# Patient Record
Sex: Female | Born: 1950
Health system: Southern US, Community
[De-identification: ages and names within clinical notes are randomized; demographics above are authoritative.]

## PROBLEM LIST (undated history)

## (undated) DIAGNOSIS — K629 Disease of anus and rectum, unspecified: Secondary | ICD-10-CM

## (undated) DIAGNOSIS — G589 Mononeuropathy, unspecified: Secondary | ICD-10-CM

## (undated) DIAGNOSIS — E785 Hyperlipidemia, unspecified: Secondary | ICD-10-CM

## (undated) DIAGNOSIS — Z8719 Personal history of other diseases of the digestive system: Secondary | ICD-10-CM

## (undated) DIAGNOSIS — M8580 Other specified disorders of bone density and structure, unspecified site: Secondary | ICD-10-CM

## (undated) DIAGNOSIS — Z973 Presence of spectacles and contact lenses: Secondary | ICD-10-CM

## (undated) DIAGNOSIS — Z87442 Personal history of urinary calculi: Secondary | ICD-10-CM

## (undated) DIAGNOSIS — I10 Essential (primary) hypertension: Secondary | ICD-10-CM

## (undated) DIAGNOSIS — K589 Irritable bowel syndrome without diarrhea: Secondary | ICD-10-CM

## (undated) DIAGNOSIS — M4800 Spinal stenosis, site unspecified: Secondary | ICD-10-CM

## (undated) DIAGNOSIS — K317 Polyp of stomach and duodenum: Secondary | ICD-10-CM

## (undated) DIAGNOSIS — E039 Hypothyroidism, unspecified: Secondary | ICD-10-CM

## (undated) DIAGNOSIS — R159 Full incontinence of feces: Secondary | ICD-10-CM

## (undated) DIAGNOSIS — M549 Dorsalgia, unspecified: Secondary | ICD-10-CM

## (undated) DIAGNOSIS — M5126 Other intervertebral disc displacement, lumbar region: Secondary | ICD-10-CM

## (undated) DIAGNOSIS — K648 Other hemorrhoids: Secondary | ICD-10-CM

## (undated) DIAGNOSIS — M5136 Other intervertebral disc degeneration, lumbar region: Secondary | ICD-10-CM

## (undated) DIAGNOSIS — M1990 Unspecified osteoarthritis, unspecified site: Secondary | ICD-10-CM

## (undated) DIAGNOSIS — G709 Myoneural disorder, unspecified: Secondary | ICD-10-CM

## (undated) DIAGNOSIS — G43909 Migraine, unspecified, not intractable, without status migrainosus: Secondary | ICD-10-CM

## (undated) DIAGNOSIS — K219 Gastro-esophageal reflux disease without esophagitis: Secondary | ICD-10-CM

## (undated) DIAGNOSIS — E049 Nontoxic goiter, unspecified: Secondary | ICD-10-CM

## (undated) DIAGNOSIS — D734 Cyst of spleen: Secondary | ICD-10-CM

## (undated) DIAGNOSIS — E781 Pure hyperglyceridemia: Secondary | ICD-10-CM

## (undated) DIAGNOSIS — J189 Pneumonia, unspecified organism: Secondary | ICD-10-CM

## (undated) DIAGNOSIS — M51369 Other intervertebral disc degeneration, lumbar region without mention of lumbar back pain or lower extremity pain: Secondary | ICD-10-CM

## (undated) DIAGNOSIS — Z86010 Personal history of colonic polyps: Secondary | ICD-10-CM

## (undated) DIAGNOSIS — R7303 Prediabetes: Secondary | ICD-10-CM

## (undated) DIAGNOSIS — K8020 Calculus of gallbladder without cholecystitis without obstruction: Secondary | ICD-10-CM

## (undated) DIAGNOSIS — G629 Polyneuropathy, unspecified: Secondary | ICD-10-CM

## (undated) DIAGNOSIS — R152 Fecal urgency: Secondary | ICD-10-CM

## (undated) HISTORY — DX: Pneumonia, unspecified organism: J18.9

## (undated) HISTORY — DX: Hypothyroidism, unspecified: E03.9

## (undated) HISTORY — DX: Other specified disorders of bone density and structure, unspecified site: M85.80

## (undated) HISTORY — DX: Myoneural disorder, unspecified: G70.9

## (undated) HISTORY — DX: Gastro-esophageal reflux disease without esophagitis: K21.9

## (undated) HISTORY — DX: Unspecified osteoarthritis, unspecified site: M19.90

## (undated) HISTORY — DX: Prediabetes: R73.03

## (undated) HISTORY — PX: NEPHROSTOMY: SHX1014

## (undated) HISTORY — DX: Nontoxic goiter, unspecified: E04.9

## (undated) HISTORY — DX: Migraine, unspecified, not intractable, without status migrainosus: G43.909

## (undated) HISTORY — PX: KIDNEY STONE SURGERY: SHX686

## (undated) HISTORY — DX: Essential (primary) hypertension: I10

## (undated) HISTORY — DX: Hyperlipidemia, unspecified: E78.5

## (undated) HISTORY — DX: Polyp of stomach and duodenum: K31.7

---

## 1998-11-24 HISTORY — PX: PARTIAL HYSTERECTOMY: SHX80

## 2006-11-24 HISTORY — PX: LITHOTRIPSY: SUR834

## 2013-02-10 HISTORY — PX: ESOPHAGOGASTRODUODENOSCOPY: SHX1529

## 2014-11-24 HISTORY — PX: CERVICAL FUSION: SHX112

## 2016-11-24 HISTORY — PX: APPENDECTOMY: SHX54

## 2017-01-22 DIAGNOSIS — J189 Pneumonia, unspecified organism: Secondary | ICD-10-CM | POA: Insufficient documentation

## 2017-01-22 HISTORY — DX: Pneumonia, unspecified organism: J18.9

## 2017-12-04 DIAGNOSIS — R69 Illness, unspecified: Secondary | ICD-10-CM | POA: Diagnosis not present

## 2017-12-31 DIAGNOSIS — Z1211 Encounter for screening for malignant neoplasm of colon: Secondary | ICD-10-CM | POA: Diagnosis not present

## 2017-12-31 DIAGNOSIS — Z7689 Persons encountering health services in other specified circumstances: Secondary | ICD-10-CM | POA: Diagnosis not present

## 2017-12-31 DIAGNOSIS — R2990 Unspecified symptoms and signs involving the nervous system: Secondary | ICD-10-CM | POA: Diagnosis not present

## 2017-12-31 DIAGNOSIS — Z23 Encounter for immunization: Secondary | ICD-10-CM | POA: Diagnosis not present

## 2017-12-31 DIAGNOSIS — R69 Illness, unspecified: Secondary | ICD-10-CM | POA: Diagnosis not present

## 2018-01-13 ENCOUNTER — Encounter: Payer: Self-pay | Admitting: Neurology

## 2018-02-01 ENCOUNTER — Encounter: Payer: Self-pay | Admitting: Neurology

## 2018-02-01 ENCOUNTER — Other Ambulatory Visit (INDEPENDENT_AMBULATORY_CARE_PROVIDER_SITE_OTHER): Payer: Medicare HMO

## 2018-02-01 ENCOUNTER — Ambulatory Visit (INDEPENDENT_AMBULATORY_CARE_PROVIDER_SITE_OTHER): Payer: Medicare HMO | Admitting: Neurology

## 2018-02-01 VITALS — BP 128/70 | HR 92 | Resp 16 | Ht 65.5 in | Wt 213.2 lb

## 2018-02-01 DIAGNOSIS — G43009 Migraine without aura, not intractable, without status migrainosus: Secondary | ICD-10-CM

## 2018-02-01 DIAGNOSIS — G620 Drug-induced polyneuropathy: Secondary | ICD-10-CM

## 2018-02-01 LAB — SEDIMENTATION RATE: Sed Rate: 11 mm/hr (ref 0–30)

## 2018-02-01 NOTE — Patient Instructions (Addendum)
1.  Increase nortriptyline to 20mg  at bedtime to also treat nerve pain.  We can increase dose further in 4 weeks if needed. 2.  We will check labs:  ANA, Sed Rate, B12, folate, TSH, SPEP/IFE, Lyme Your provider has requested that you have labwork completed today. Please go to [facility] ([id number]) on the second floor of this building before leaving the office today. You do not need to check in. If you are not called within 15 minutes please check with the front desk.  3.  We will order a nerve study of the lower extremities The procedure will take 90 mins and do not apply lotion before the test  4.  Follow up in 4 months.

## 2018-02-01 NOTE — Progress Notes (Signed)
NEUROLOGY CONSULTATION NOTE  [name] MRN: [id number] DOB: [birth year]  Referring provider: Dr. Nyra Capes Primary care provider: Dr. Nyra Capes  Reason for consult:  migraine  HISTORY OF PRESENT ILLNESS: Leslie Werner is a 67 year old female with hypertriglyceridemia, hypertension, peripheral neuropathy secondary to long-term antibiotic use and history of cervical spine surgery and kidney stones who presents for migraine and neuropathy.  History supplemented by referring provider's note.  MIGRAINES: Onset:  Early 38s.  Past history of headache. Location:  Bi-frontal or occipital Quality:  pounding Intensity:  Moderate.  She denies new headache, thunderclap headache or severe headache that wakes her from sleep. Aura:  no Prodrome:  no Postdrome:  no Associated symptoms:  Nausea, photophobia, phonophobia.  No vomiting or visual disturbance.  She denies associated unilateral numbness or weakness. Duration:  1 day Frequency:  Once every 2 months Frequency of abortive medication: once every 2 months Triggers/exacerbating factors:  stress3 Relieving factors:  sleep Activity:  Aggravates.  Current NSAIDS:  ASA 81mg  Current analgesics:  Percocet (takes 1 every night for nerve pain.  As needed for migraine) Current triptans:  no Current anti-emetic:  no Current muscle relaxants:  no Current anti-anxiolytic:  no Current sleep aide:  no Current Antihypertensive medications:  amlodipine besy-benazepril Current Antidepressant medications:  nortriptyline 10mg  Current Anticonvulsant medications:  pregablin 150mg  at bedtime Current Vitamins/Herbal/Supplements:  B12 175mcg, D3 1000 IU, fish oil, MVI Current Antihistamines/Decongestants:  no Other therapy:  no  Past NSAIDS: Cannot take other NSAIDs due to GI polyps Past analgesics:  Tylenol, tramadol Past abortive triptans:  no Past muscle relaxants:  no Past anti-emetic:  no Past antihypertensive medications:  no Past  antidepressant medications:  no Past anticonvulsant medications:  no Past vitamins/Herbal/Supplements:  no Past antihistamines/decongestants:  no Other past therapies:  no  Caffeine:  No Alcohol:  rarely Smoker:  no Diet:  hydrates Exercise:  walks Depression:  no; Anxiety:  no Other pain:  Neuropathy in feet. Sleep hygiene:  good  NEUROPATHY: She developed peripheral neuropathy following a long course of antibiotics (Levaquin and Avelox) after kidney infection from stones in 2007-2008.  She developed burning pain, shooting pain, numbness, pins and needles sensation in the last 3 toes of both feet.  She denies back pain or radicular pain down the legs.  She notices the discomfort mostly in late afternoon and at night.  She takes 1 Percocet a night which helps.  Tramadol and Tylenol are ineffective.  She takes Lyrica 150mg  at bedtime.  She previously took gabapentin which was mildly effective but dose may not have been optimized.  She feels that the pain and numbness have gotten worse.  PAST MEDICAL HISTORY: Past Medical History:  Diagnosis Date  . Hypertension   . Migraine   . Neuropathy     PAST SURGICAL HISTORY: Past Surgical History:  Procedure Laterality Date  . APPENDECTOMY  2018  . CERVICAL FUSION  2016  . LITHOTRIPSY  2008  . PARTIAL HYSTERECTOMY  2000    MEDICATIONS: Current Outpatient Medications on File Prior to Visit  Medication Sig Dispense Refill  . amLODipine-benazepril (LOTREL) 10-40 MG capsule Take 1 capsule by mouth daily.  1  . nortriptyline (PAMELOR) 10 MG capsule TAKE 1-2 CAPSULES AT BEDTIME  1  . oxyCODONE-acetaminophen (PERCOCET/ROXICET) 5-325 MG tablet TAKE 1 TABLET BY MOUTH TWICE A DAY AS NEEDED FOR PAIN *PA MANUALLY SENT*  0  . pregabalin (LYRICA) 75 MG capsule Take 150 mg by mouth 2 (two) times daily.  No current facility-administered medications on file prior to visit.     ALLERGIES: Allergies  Allergen Reactions  . Sulfa Antibiotics  Anaphylaxis  . Morphine And Related Itching  . Voltaren [Diclofenac] Hives    FAMILY HISTORY: Family History  Problem Relation Age of Onset  . Diabetes Mother   . Hypertension Mother   . Breast cancer Mother   . Prostate cancer Father   . Hypertension Maternal Grandmother   . Breast cancer Maternal Grandmother     SOCIAL HISTORY: Social History   Socioeconomic History  . Marital status: Married    Spouse name: Not on file  . Number of children: Not on file  . Years of education: Not on file  . Highest education level: Not on file  Social Needs  . Financial resource strain: Not on file  . Food insecurity - worry: Not on file  . Food insecurity - inability: Not on file  . Transportation needs - medical: Not on file  . Transportation needs - non-medical: Not on file  Occupational History  . Occupation: CNA    Comment: [employer]  Tobacco Use  . Smoking status: Former Smoker    Packs/day: 1.00    Years: 15.00    Pack years: 15.00    Types: Cigarettes    Last attempt to quit: 2005    Years since quitting: 14.1  . Smokeless tobacco: Never Used  Substance and Sexual Activity  . Alcohol use: No    Frequency: Never  . Drug use: No  . Sexual activity: Yes  Other Topics Concern  . Not on file  Social History Narrative  . Not on file    REVIEW OF SYSTEMS: Constitutional: No fevers, chills, or sweats, no generalized fatigue, change in appetite Eyes: No visual changes, double vision, eye pain Ear, nose and throat: No hearing loss, ear pain, nasal congestion, sore throat Cardiovascular: No chest pain, palpitations Respiratory:  No shortness of breath at rest or with exertion, wheezes GastrointestinaI: No nausea, vomiting, diarrhea, abdominal pain, fecal incontinence Genitourinary:  No dysuria, urinary retention or frequency Musculoskeletal:  No neck pain, back pain Integumentary: No rash, pruritus, skin lesions Neurological: as above Psychiatric: No depression,  insomnia, anxiety Endocrine: No palpitations, fatigue, diaphoresis, mood swings, change in appetite, change in weight, increased thirst Hematologic/Lymphatic:  No purpura, petechiae. Allergic/Immunologic: no itchy/runny eyes, nasal congestion, recent allergic reactions, rashes  PHYSICAL EXAM: Vitals:   02/01/18 1501  BP: 128/70  Pulse: 92  Resp: 16  SpO2: 95%   General: No acute distress.  Patient appears well-groomed.  Head:  Normocephalic/atraumatic Eyes:  fundi examined but not visualized Neck: supple, no paraspinal tenderness, full range of motion Back: No paraspinal tenderness Heart: regular rate and rhythm Lungs: Clear to auscultation bilaterally. Vascular: No carotid bruits. Neurological Exam: Mental status: alert and oriented to person, place, and time, recent and remote memory intact, fund of knowledge intact, attention and concentration intact, speech fluent and not dysarthric, language intact. Cranial nerves: CN I: not tested CN II: pupils equal, round and reactive to light, visual fields intact CN III, IV, VI:  full range of motion, no nystagmus, no ptosis CN V: facial sensation intact CN VII: upper and lower face symmetric CN VIII: hearing intact CN IX, X: gag intact, uvula midline CN XI: sternocleidomastoid and trapezius muscles intact CN XII: tongue midline Bulk & Tone: normal, no fasciculations. Motor:  5/5 throughout  Sensation:  Pinprick hypersensitivity to top of last 3 digits of both feet, reduced pinprick  sensation on bottom of last 3 toes of both feet and vibration sensation intact.  Deep Tendon Reflexes:  2+ throughout, toes downgoing.  Finger to nose testing:  Without dysmetria.  Heel to shin:  Without dysmetria.  Gait:  Normal station and stride.  Able to turn and tandem walk. Romberg negative.  IMPRESSION: 1.  Migraine without aura, stable 2.  Drug-induced peripheral neuropathy.  She states it is getting worse which warrants workup for alternative  etiology.  PLAN: 1.  Increase nortriptyline to 20mg  at bedtime to also treat neuropathic pain as well as migraine.  She is unsure if her insurance will continue to cover Lyrica, so we are holding off increasing the Lyrica. 2.  We will check labs:  ANA, Sed Rate, B12, folate, TSH, SPEP/IFE, Lyme 3.  We will order NCV-EMG of the lower extremities The procedure will take 90 mins and do not apply lotion before the test  4.  Follow up in 4 months.  Thank you for allowing me to take part in the care of this patient.  Metta Clines, DO  CC:  Marco Collie, MD

## 2018-02-02 LAB — TSH: TSH: 0.07 u[IU]/mL — ABNORMAL LOW (ref 0.35–4.50)

## 2018-02-02 LAB — LYME AB/WESTERN BLOT REFLEX

## 2018-02-02 LAB — VITAMIN B12: Vitamin B-12: 650 pg/mL (ref 211–911)

## 2018-02-02 LAB — FOLATE: Folate: >23.6 ng/mL

## 2018-02-04 LAB — PROTEIN ELECTROPHORESIS, SERUM
Albumin ELP: 4.5 g/dL (ref 3.8–4.8)
Alpha 1: 0.3 g/dL (ref 0.2–0.3)
Alpha 2: 0.7 g/dL (ref 0.5–0.9)
BETA 2: 0.4 g/dL (ref 0.2–0.5)
BETA GLOBULIN: 0.5 g/dL (ref 0.4–0.6)
Gamma Globulin: 1.1 g/dL (ref 0.8–1.7)
Total Protein: 7.5 g/dL (ref 6.1–8.1)

## 2018-02-04 LAB — IMMUNOFIXATION ELECTROPHORESIS
IGG (IMMUNOGLOBIN G), SERUM: 1125 mg/dL (ref 694–1618)
IMMUNOGLOBULIN A: 259 mg/dL (ref 81–463)
IgM, Serum: 167 mg/dL (ref 48–271)
Immunofix Electr Int: NOT DETECTED

## 2018-02-04 LAB — ANA: Anti Nuclear Antibody(ANA): POSITIVE — AB

## 2018-02-04 LAB — ANTI-NUCLEAR AB-TITER (ANA TITER): ANA Titer 1: 1:160 {titer} — ABNORMAL HIGH

## 2018-02-08 ENCOUNTER — Other Ambulatory Visit: Payer: Medicare HMO

## 2018-02-08 ENCOUNTER — Other Ambulatory Visit: Payer: Self-pay | Admitting: *Deleted

## 2018-02-08 DIAGNOSIS — R768 Other specified abnormal immunological findings in serum: Secondary | ICD-10-CM

## 2018-02-09 ENCOUNTER — Telehealth: Payer: Self-pay | Admitting: *Deleted

## 2018-02-09 NOTE — Telephone Encounter (Signed)
I spoke with patient and gave her the results.   She will be in on Tuesday for NCS and will have the lab work then.  TSH results faxed to Dr. Eustaquio Maize.  Patient aware.

## 2018-02-16 ENCOUNTER — Ambulatory Visit (INDEPENDENT_AMBULATORY_CARE_PROVIDER_SITE_OTHER): Payer: Medicare HMO | Admitting: Neurology

## 2018-02-16 ENCOUNTER — Other Ambulatory Visit: Payer: Medicare HMO

## 2018-02-16 DIAGNOSIS — R768 Other specified abnormal immunological findings in serum: Secondary | ICD-10-CM | POA: Diagnosis not present

## 2018-02-16 DIAGNOSIS — G43009 Migraine without aura, not intractable, without status migrainosus: Secondary | ICD-10-CM

## 2018-02-16 DIAGNOSIS — G620 Drug-induced polyneuropathy: Secondary | ICD-10-CM

## 2018-02-16 NOTE — Procedures (Signed)
[facility]  [place], [place]  [place], [place] [address] Tel: [phone] Fax:  [phone] Test Date:  02/16/2018  Patient: Leslie Werner DOB: 10/24/1951 Physician: Narda Amber, DO  Sex: Female Height: 5\' 5"  Ref Phys: Metta Clines, DO  ID#: [id number] Temp: 35.7C Technician:    Patient Complaints: This is a 67 year old female with history of drug-induced neuropathy referred for evaluation of worsening paresthesias of the feet.  NCV & EMG Findings: Extensive electrodiagnostic testing of the right lower extremity and additional studies of the left shows: 1. Bilateral sural and superficial peroneal sensory responses are within normal limits. 2. Bilateral tibial and peroneal motor responses are within normal limits. 3. Bilateral tibial H reflex studies are within normal limits. 4. There is no evidence of active or chronic motor axon loss changes affecting any of the tested muscles. Motor unit configuration and recruitment pattern is within normal limits.  Impression: This is a normal study of the lower extremities. In particular, there is no evidence of a large fiber sensorimotor polyneuropathy or lumbosacral radiculopathy.   ___________________________ Narda Amber, DO    Nerve Conduction Studies Anti Sensory Summary Table   Site NR Peak (ms) Norm Peak (ms) P-T Amp (V) Norm P-T Amp  Left Sup Peroneal Anti Sensory (Ant Lat Mall)  35.7C  12 cm    2.5 <4.6 7.0 >3  Right Sup Peroneal Anti Sensory (Ant Lat Mall)  35.7C  12 cm    3.3 <4.6 5.0 >3  Left Sural Anti Sensory (Lat Mall)  35.7C  Calf    3.0 <4.6 11.6 >3  Right Sural Anti Sensory (Lat Mall)  35.7C  Calf    2.7 <4.6 10.0 >3   Motor Summary Table   Site NR Onset (ms) Norm Onset (ms) O-P Amp (mV) Norm O-P Amp Site1 Site2 Delta-0 (ms) Dist (cm) Vel (m/s) Norm Vel (m/s)  Left Peroneal Motor (Ext Dig Brev)  35.7C  Ankle    3.3 <6.0 5.3 >2.5 B Fib Ankle 7.9 38.0 48 >40  B Fib    11.2  4.6  Poplt B  Fib 1.1 8.0 73 >40  Poplt    12.3  4.6         Right Peroneal Motor (Ext Dig Brev)  35.7C  Ankle    3.3 <6.0 3.9 >2.5 B Fib Ankle 7.6 37.0 49 >40  B Fib    10.9  3.9  Poplt B Fib 1.3 8.0 62 >40  Poplt    12.2  3.7         Left Tibial Motor (Abd Hall Brev)  35.7C  Ankle    3.6 <6.0 8.8 >4 Knee Ankle 8.6 39.0 45 >40  Knee    12.2  5.5         Right Tibial Motor (Abd Hall Brev)  35.7C  Ankle    2.6 <6.0 8.9 >4 Knee Ankle 8.7 38.0 44 >40  Knee    11.3  4.9          H Reflex Studies   NR H-Lat (ms) Lat Norm (ms) L-R H-Lat (ms)  Left Tibial (Gastroc)  35.7C     33.88 <35 0.00  Right Tibial (Gastroc)  35.7C     33.88 <35 0.00   EMG   Side Muscle Ins Act Fibs Psw Fasc Number Recrt Dur Dur. Amp Amp. Poly Poly. Comment  Right AntTibialis Nml Nml Nml Nml Nml Nml Nml Nml Nml Nml Nml Nml N/A  Right Gastroc Nml Nml  Nml Nml Nml Nml Nml Nml Nml Nml Nml Nml N/A  Right Flex Dig Long Nml Nml Nml Nml Nml Nml Nml Nml Nml Nml Nml Nml N/A  Right RectFemoris Nml Nml Nml Nml Nml Nml Nml Nml Nml Nml Nml Nml N/A  Right GluteusMed Nml Nml Nml Nml Nml Nml Nml Nml Nml Nml Nml Nml N/A  Right BicepsFemS Nml Nml Nml Nml Nml Nml Nml Nml Nml Nml Nml Nml N/A  Left AntTibialis Nml Nml Nml Nml Nml Nml Nml Nml Nml Nml Nml Nml N/A  Left Gastroc Nml Nml Nml Nml Nml Nml Nml Nml Nml Nml Nml Nml N/A  Left Flex Dig Long Nml Nml Nml Nml Nml Nml Nml Nml Nml Nml Nml Nml N/A  Left RectFemoris Nml Nml Nml Nml Nml Nml Nml Nml Nml Nml Nml Nml N/A  Left GluteusMed Nml Nml Nml Nml Nml Nml Nml Nml Nml Nml Nml Nml N/A  Left BicepsFemS Nml Nml Nml Nml Nml Nml Nml Nml Nml Nml Nml Nml N/A      Waveforms:

## 2018-02-18 LAB — ANA+ENA+DNA/DS+SCL 70+SJOSSA/B
ANA Titer 1: NEGATIVE
ENA RNP AB: 0.7 AI (ref 0.0–0.9)
ENA SM Ab Ser-aCnc: <0.2 AI (ref 0.0–0.9)
ENA SSA (RO) Ab: <0.2 AI (ref 0.0–0.9)
ENA SSB (LA) AB: 0.3 AI (ref 0.0–0.9)
Scleroderma SCL-70: <0.2 AI (ref 0.0–0.9)
dsDNA Ab: 2 IU/mL (ref 0–9)

## 2018-02-18 LAB — ANTI-SMOOTH MUSCLE ANTIBODY, IGG: Smooth Muscle Ab: 10 Units (ref 0–19)

## 2018-02-18 LAB — ANTI-JO 1 ANTIBODY, IGG

## 2018-02-25 ENCOUNTER — Encounter: Payer: Self-pay | Admitting: Neurology

## 2018-02-25 ENCOUNTER — Telehealth: Payer: Self-pay

## 2018-02-25 NOTE — Telephone Encounter (Signed)
Called and spoke with Pt, advsd her of EMG results.

## 2018-02-25 NOTE — Telephone Encounter (Signed)
-----   Message from Pieter Partridge, DO sent at 02/25/2018  1:39 PM EDT ----- Nerve study does not reveal any evidence of neuropathy.  Follow up labs do not indicate any other specific cause for neuropathy.  Often we don't find a specific cause for nerve-type pain and discomfort.

## 2018-03-04 DIAGNOSIS — Z139 Encounter for screening, unspecified: Secondary | ICD-10-CM | POA: Diagnosis not present

## 2018-03-04 DIAGNOSIS — Z1331 Encounter for screening for depression: Secondary | ICD-10-CM | POA: Diagnosis not present

## 2018-03-04 DIAGNOSIS — Z0000 Encounter for general adult medical examination without abnormal findings: Secondary | ICD-10-CM | POA: Diagnosis not present

## 2018-03-04 DIAGNOSIS — R768 Other specified abnormal immunological findings in serum: Secondary | ICD-10-CM | POA: Diagnosis not present

## 2018-03-04 DIAGNOSIS — R2990 Unspecified symptoms and signs involving the nervous system: Secondary | ICD-10-CM | POA: Diagnosis not present

## 2018-03-04 DIAGNOSIS — K921 Melena: Secondary | ICD-10-CM | POA: Diagnosis not present

## 2018-03-18 ENCOUNTER — Encounter: Payer: Self-pay | Admitting: Neurology

## 2018-03-24 ENCOUNTER — Encounter: Payer: Self-pay | Admitting: Gastroenterology

## 2018-03-29 DIAGNOSIS — R2990 Unspecified symptoms and signs involving the nervous system: Secondary | ICD-10-CM | POA: Diagnosis not present

## 2018-03-29 DIAGNOSIS — E781 Pure hyperglyceridemia: Secondary | ICD-10-CM | POA: Diagnosis not present

## 2018-03-29 DIAGNOSIS — I10 Essential (primary) hypertension: Secondary | ICD-10-CM | POA: Diagnosis not present

## 2018-04-15 DIAGNOSIS — R110 Nausea: Secondary | ICD-10-CM | POA: Diagnosis not present

## 2018-04-15 DIAGNOSIS — R6889 Other general symptoms and signs: Secondary | ICD-10-CM | POA: Diagnosis not present

## 2018-04-27 DIAGNOSIS — E663 Overweight: Secondary | ICD-10-CM | POA: Diagnosis not present

## 2018-04-27 DIAGNOSIS — G629 Polyneuropathy, unspecified: Secondary | ICD-10-CM | POA: Diagnosis not present

## 2018-04-27 DIAGNOSIS — M545 Low back pain: Secondary | ICD-10-CM | POA: Diagnosis not present

## 2018-04-27 DIAGNOSIS — G894 Chronic pain syndrome: Secondary | ICD-10-CM | POA: Diagnosis not present

## 2018-04-29 DIAGNOSIS — M47816 Spondylosis without myelopathy or radiculopathy, lumbar region: Secondary | ICD-10-CM | POA: Diagnosis not present

## 2018-04-29 DIAGNOSIS — M545 Low back pain: Secondary | ICD-10-CM | POA: Diagnosis not present

## 2018-05-18 ENCOUNTER — Ambulatory Visit: Payer: Medicare HMO | Admitting: Gastroenterology

## 2018-05-18 ENCOUNTER — Encounter: Payer: Self-pay | Admitting: Gastroenterology

## 2018-05-18 ENCOUNTER — Encounter (INDEPENDENT_AMBULATORY_CARE_PROVIDER_SITE_OTHER): Payer: Self-pay

## 2018-05-18 VITALS — BP 120/72 | HR 92 | Ht 63.0 in | Wt 213.2 lb

## 2018-05-18 DIAGNOSIS — R152 Fecal urgency: Secondary | ICD-10-CM

## 2018-05-18 DIAGNOSIS — K219 Gastro-esophageal reflux disease without esophagitis: Secondary | ICD-10-CM | POA: Diagnosis not present

## 2018-05-18 DIAGNOSIS — R159 Full incontinence of feces: Secondary | ICD-10-CM

## 2018-05-18 DIAGNOSIS — K648 Other hemorrhoids: Secondary | ICD-10-CM

## 2018-05-18 DIAGNOSIS — K625 Hemorrhage of anus and rectum: Secondary | ICD-10-CM | POA: Diagnosis not present

## 2018-05-18 DIAGNOSIS — Z1211 Encounter for screening for malignant neoplasm of colon: Secondary | ICD-10-CM

## 2018-05-18 DIAGNOSIS — K580 Irritable bowel syndrome with diarrhea: Secondary | ICD-10-CM | POA: Diagnosis not present

## 2018-05-18 DIAGNOSIS — R197 Diarrhea, unspecified: Secondary | ICD-10-CM | POA: Diagnosis not present

## 2018-05-18 MED ORDER — NA SULFATE-K SULFATE-MG SULF 17.5-3.13-1.6 GM/177ML PO SOLN: 1.0000 | Freq: Once | ORAL | 0 refills | Status: AC

## 2018-05-18 MED ORDER — COLESEVELAM HCL 625 MG PO TABS: 625.0000 mg | ORAL_TABLET | Freq: Two times a day (BID) | ORAL | 3 refills | Status: DC

## 2018-05-18 NOTE — Patient Instructions (Signed)
You have been scheduled for a colonoscopy. Please follow written instructions given to you at your visit today.  Please pick up your prep supplies at the pharmacy within the next 1-3 days. If you use inhalers (even only as needed), please bring them with you on the day of your procedure. Your physician has requested that you go to www.startemmi.com and enter the access code given to you at your visit today. This web site gives a general overview about your procedure. However, you should still follow specific instructions given to you by our office regarding your preparation for the procedure.  Use Benefiber 1 tablespoon 2-3 times a day with meals  Use Preparation H as needed at bedtime    Kegel Exercises Kegel exercises help strengthen the muscles that support the rectum, vagina, small intestine, bladder, and uterus. Doing Kegel exercises can help:  Improve bladder and bowel control.  Improve sexual response.  Reduce problems and discomfort during pregnancy.  Kegel exercises involve squeezing your pelvic floor muscles, which are the same muscles you squeeze when you try to stop the flow of urine. The exercises can be done while sitting, standing, or lying down, but it is best to vary your position. Phase 1 exercises 1. Squeeze your pelvic floor muscles tight. You should feel a tight lift in your rectal area. If you are a female, you should also feel a tightness in your vaginal area. Keep your stomach, buttocks, and legs relaxed. 2. Hold the muscles tight for up to 10 seconds. 3. Relax your muscles. Repeat this exercise 50 times a day or as many times as told by your health care provider. Continue to do this exercise for at least 4-6 weeks or for as long as told by your health care provider. This information is not intended to replace advice given to you by your health care provider. Make sure you discuss any questions you have with your health care provider. Document Released: 10/27/2012  Document Revised: 07/05/2016 Document Reviewed: 09/30/2015 Elsevier Interactive Patient Education  [employer].

## 2018-05-18 NOTE — Progress Notes (Signed)
Leslie Werner    [id number]    1951-10-25  [facility], Eustaquio Maize, MD  Referring Physician: Marco Collie, MD [address] [place], [place] [address]  Chief complaint: Fecal urgency and incontinence, bright red blood per rectum, hemorrhoids  HPI: 67 year old female is here for new patient visit with complaints of intermittent bright red blood per rectum for the past 3 to 4 months.  According to patient she had colonoscopy about 10 years ago and was normal, report is not available during this visit.  She started noticing small volume bright red blood after a bowel movement when she wipes intermittently in the past 3 to 4 months but has not had any episodes in the last 3 weeks.  Denies constipation but she has increased bowel frequency with semi-formed to liquid stool once or twice daily with urgency and occasional fecal incontinence.  Fecal urgency and diarrhea is worse when she consumes milk products. She has history of chronic GERD and is currently on Pepcid as needed.  Previously was taking omeprazole daily but has stopped it because of concern of potential side effects with long-term use. Denies any nausea, vomiting, abdominal pain, dysphagia or odynophagia.  No loss of appetite or weight loss.  No family history of GI malignancy.  EGD February 10, 2013 in [place] by Dr. Gerri Spore ( patient brought in the report with her) Irregular Z line at 38 cm, biopsies were taken and 6 diminutive 2 to 4 mm pedunculated polyps were removed from stomach with cold snare, duodenum was normal Pathology report is not available.  Outpatient Encounter Medications as of 05/18/2018  Medication Sig  . amLODipine-benazepril (LOTREL) 10-40 MG capsule Take 1 capsule by mouth daily.  . diclofenac sodium (VOLTAREN) 1 % GEL Apply 1 application topically 2 (two) times daily as needed.  . nortriptyline (PAMELOR) 10 MG capsule TAKE 1-2 CAPSULES AT BEDTIME  .  oxyCODONE-acetaminophen (PERCOCET/ROXICET) 5-325 MG tablet TAKE 1 TABLET BY MOUTH TWICE A DAY AS NEEDED FOR PAIN *PA MANUALLY SENT*  . pregabalin (LYRICA) 150 MG capsule Take 150 mg by mouth 2 (two) times daily.  . colesevelam (WELCHOL) 625 MG tablet Take 1 tablet (625 mg total) by mouth 2 (two) times daily with a meal.  . Na Sulfate-K Sulfate-Mg Sulf (SUPREP BOWEL PREP KIT) 17.5-3.13-1.6 GM/177ML SOLN Take 1 kit by mouth once for 1 dose.  . [DISCONTINUED] pregabalin (LYRICA) 75 MG capsule Take 150 mg by mouth 2 (two) times daily.   No facility-administered encounter medications on file as of 05/18/2018.     Allergies as of 05/18/2018 - Review Complete 05/18/2018  Allergen Reaction Noted  . Sulfa antibiotics Anaphylaxis 02/01/2018  . Morphine and related Itching 02/01/2018  . Voltaren [diclofenac] Hives 02/01/2018    Past Medical History:  Diagnosis Date  . Arthritis   . Gastric polyps   . GERD (gastroesophageal reflux disease)   . Hypertension   . Hypothyroidism   . Kidney stones   . Migraine   . Neuropathy   . Pneumonia   . Thyroid goiter     Past Surgical History:  Procedure Laterality Date  . APPENDECTOMY  2018  . CERVICAL FUSION  2016  . KIDNEY STONE SURGERY     x 2  . LITHOTRIPSY  2008   2-3 litho  . PARTIAL HYSTERECTOMY  2000    Family History  Problem Relation Age of Onset  . Diabetes Mother   . Hypertension  Mother   . Breast cancer Mother   . Colon polyps Mother   . Heart disease Mother   . Prostate cancer Father   . Hypertension Maternal Grandmother   . Breast cancer Maternal Grandmother        ? don't actually know but she had a breast removed  . Heart disease Maternal Grandmother   . Other Sister        Breast tumors    Social History   Socioeconomic History  . Marital status: Married    Spouse name: Not on file  . Number of children: 3  . Years of education: Not on file  . Highest education level: Not on file  Occupational History  .  Occupation: CNA    Comment: [employer]  . Financial resource strain: Not on file  . Food insecurity:    Worry: Not on file    Inability: Not on file  . Transportation needs:    Medical: Not on file    Non-medical: Not on file  Tobacco Use  . Smoking status: Former Smoker    Packs/day: 1.00    Years: 15.00    Pack years: 15.00    Types: Cigarettes    Last attempt to quit: 2005    Years since quitting: 14.4  . Smokeless tobacco: Never Used  Substance and Sexual Activity  . Alcohol use: No    Frequency: Never  . Drug use: No  . Sexual activity: Yes  Lifestyle  . Physical activity:    Days per week: Not on file    Minutes per session: Not on file  . Stress: Not on file  Relationships  . Social connections:    Talks on phone: Not on file    Gets together: Not on file    Attends religious service: Not on file    Active member of club or organization: Not on file    Attends meetings of clubs or organizations: Not on file    Relationship status: Not on file  . Intimate partner violence:    Fear of current or ex partner: Not on file    Emotionally abused: Not on file    Physically abused: Not on file    Forced sexual activity: Not on file  Other Topics Concern  . Not on file  Social History Narrative  . Not on file      Review of systems: Review of Systems  Constitutional: Negative for fever and chills.  HENT: Negative.   Eyes: Negative for blurred vision.  Respiratory: Negative for cough, shortness of breath and wheezing.   Cardiovascular: Negative for chest pain and palpitations.  Gastrointestinal: as per HPI Genitourinary: Negative for dysuria, urgency, frequency and hematuria.  Musculoskeletal: Positive for myalgias, back pain and joint pain.  Skin: Negative for itching and rash.  Neurological: Negative for dizziness, tremors, focal weakness, seizures and loss of consciousness.  Positive for headaches Endo/Heme/Allergies: Positive for seasonal  allergies.  Psychiatric/Behavioral: Negative for depression, suicidal ideas and hallucinations.  All other systems reviewed and are negative.   Physical Exam: Vitals:   05/18/18 1403  BP: 120/72  Pulse: 92   Body mass index is 37.78 kg/m. Gen:      No acute distress HEENT:  EOMI, sclera anicteric Neck:     No masses; no thyromegaly Lungs:    Clear to auscultation bilaterally; normal respiratory effort CV:         Regular rate and rhythm; no murmurs Abd:      +  bowel sounds; soft, non-tender; no palpable masses, no distension Ext:    No edema; adequate peripheral perfusion Skin:      Warm and dry; no rash Neuro: alert and oriented x 3 Psych: normal mood and affect Rectal exam: Normal anal sphincter tone, no anal fissure or external hemorrhoids Anoscopy: Small internal hemorrhoids, no active bleeding, normal dentate line, no visible nodules  Data Reviewed:  Reviewed labs, radiology imaging, old records and pertinent past GI work up   Assessment and Plan/Recommendations:  67 year old female with history of hypertension, goiter, chronic GERD, irritable bowel syndrome here with complaints of fecal urgency, incontinence and intermittent episodes of bright red blood per rectum Patient is due for colorectal cancer screening as last colonoscopy was over 10 years ago, report is not available. Schedule for colonoscopy for evaluation and exclude neoplastic lesion She has grade 2-3 internal hemorrhoids on rectal exam Benefiber 1 tablespoon 3 times daily with meals Advised patient to avoid excessive straining Preparation H at bedtime as needed If continues to have symptomatic hemorrhoids and no other etiology on colonoscopy, will plan for hemorrhoidal band ligation  Diarrhea with fecal urgency and intermittent fecal incontinence We will do a trial of Welchol 1 tablet twice daily for possible bile salt induced diarrhea Advised patient to follow lactose-free diet as milk and dairy  products are trigger for her symptoms Kegel exercises, if continues to have persistent symptoms will refer to pelvic floor physical therapy to improve sphincter tone  GERD: Discussed antireflux measures and lifestyle modification. Continue H2 blocker as needed.  Symptoms currently well controlled.  Patient had irregular Z line and gastric polyps removed during endoscopy in 2014, biopsy results are not available, will request from her previous GI  The risks and benefits as well as alternatives of endoscopic procedure(s) have been discussed and reviewed. All questions answered. The patient agrees to proceed.   Damaris Hippo , MD [phone] of the right pending  CC: Marco Collie, MD

## 2018-05-19 ENCOUNTER — Ambulatory Visit: Payer: Medicare HMO | Admitting: Neurology

## 2018-05-21 DIAGNOSIS — G629 Polyneuropathy, unspecified: Secondary | ICD-10-CM | POA: Diagnosis not present

## 2018-05-21 DIAGNOSIS — M545 Low back pain: Secondary | ICD-10-CM | POA: Diagnosis not present

## 2018-05-21 DIAGNOSIS — M47816 Spondylosis without myelopathy or radiculopathy, lumbar region: Secondary | ICD-10-CM | POA: Diagnosis not present

## 2018-05-21 DIAGNOSIS — G894 Chronic pain syndrome: Secondary | ICD-10-CM | POA: Diagnosis not present

## 2018-05-21 DIAGNOSIS — E663 Overweight: Secondary | ICD-10-CM | POA: Diagnosis not present

## 2018-05-21 DIAGNOSIS — M5136 Other intervertebral disc degeneration, lumbar region: Secondary | ICD-10-CM | POA: Diagnosis not present

## 2018-06-01 DIAGNOSIS — Z1231 Encounter for screening mammogram for malignant neoplasm of breast: Secondary | ICD-10-CM | POA: Diagnosis not present

## 2018-06-08 ENCOUNTER — Ambulatory Visit: Payer: Medicare HMO | Admitting: Neurology

## 2018-06-22 DIAGNOSIS — G629 Polyneuropathy, unspecified: Secondary | ICD-10-CM | POA: Diagnosis not present

## 2018-06-22 DIAGNOSIS — M545 Low back pain: Secondary | ICD-10-CM | POA: Diagnosis not present

## 2018-06-22 DIAGNOSIS — M5136 Other intervertebral disc degeneration, lumbar region: Secondary | ICD-10-CM | POA: Diagnosis not present

## 2018-06-22 DIAGNOSIS — M47816 Spondylosis without myelopathy or radiculopathy, lumbar region: Secondary | ICD-10-CM | POA: Diagnosis not present

## 2018-06-22 DIAGNOSIS — G894 Chronic pain syndrome: Secondary | ICD-10-CM | POA: Diagnosis not present

## 2018-06-22 DIAGNOSIS — E663 Overweight: Secondary | ICD-10-CM | POA: Diagnosis not present

## 2018-06-29 ENCOUNTER — Ambulatory Visit (AMBULATORY_SURGERY_CENTER): Payer: Medicare HMO | Admitting: Gastroenterology

## 2018-06-29 ENCOUNTER — Encounter: Payer: Self-pay | Admitting: Gastroenterology

## 2018-06-29 VITALS — BP 134/68 | HR 75 | Temp 99.3°F | Resp 13 | Ht 63.0 in | Wt 213.0 lb

## 2018-06-29 DIAGNOSIS — D129 Benign neoplasm of anus and anal canal: Secondary | ICD-10-CM

## 2018-06-29 DIAGNOSIS — Z86010 Personal history of colonic polyps: Secondary | ICD-10-CM | POA: Insufficient documentation

## 2018-06-29 DIAGNOSIS — K625 Hemorrhage of anus and rectum: Secondary | ICD-10-CM

## 2018-06-29 DIAGNOSIS — D125 Benign neoplasm of sigmoid colon: Secondary | ICD-10-CM

## 2018-06-29 DIAGNOSIS — I10 Essential (primary) hypertension: Secondary | ICD-10-CM | POA: Diagnosis not present

## 2018-06-29 DIAGNOSIS — D122 Benign neoplasm of ascending colon: Secondary | ICD-10-CM | POA: Diagnosis not present

## 2018-06-29 DIAGNOSIS — D123 Benign neoplasm of transverse colon: Secondary | ICD-10-CM | POA: Diagnosis not present

## 2018-06-29 DIAGNOSIS — K626 Ulcer of anus and rectum: Secondary | ICD-10-CM | POA: Diagnosis not present

## 2018-06-29 DIAGNOSIS — K629 Disease of anus and rectum, unspecified: Secondary | ICD-10-CM | POA: Diagnosis not present

## 2018-06-29 HISTORY — PX: COLONOSCOPY W/ POLYPECTOMY: SHX1380

## 2018-06-29 HISTORY — DX: Personal history of colonic polyps: Z86.010

## 2018-06-29 MED ORDER — SODIUM CHLORIDE 0.9 % IV SOLN: 500.0000 mL | Freq: Once | INTRAVENOUS | Status: DC

## 2018-06-29 NOTE — Progress Notes (Signed)
Called to room to assist during endoscopic procedure.  Patient ID and intended procedure confirmed with present staff. Received instructions for my participation in the procedure from the performing physician.  

## 2018-06-29 NOTE — Patient Instructions (Signed)
YOU HAD AN ENDOSCOPIC PROCEDURE TODAY AT [facility]:   Refer to the procedure report that was given to you for any specific questions about what was found during the examination.  If the procedure report does not answer your questions, please call your gastroenterologist to clarify.  If you requested that your care partner not be given the details of your procedure findings, then the procedure report has been included in a sealed envelope for you to review at your convenience later.  YOU SHOULD EXPECT: Some feelings of bloating in the abdomen. Passage of more gas than usual.  Walking can help get rid of the air that was put into your GI tract during the procedure and reduce the bloating. If you had a lower endoscopy (such as a colonoscopy or flexible sigmoidoscopy) you may notice spotting of blood in your stool or on the toilet paper. If you underwent a bowel prep for your procedure, you may not have a normal bowel movement for a few days.  Please Note:  You might notice some irritation and congestion in your nose or some drainage.  This is from the oxygen used during your procedure.  There is no need for concern and it should clear up in a day or so.  SYMPTOMS TO REPORT IMMEDIATELY:   Following lower endoscopy (colonoscopy or flexible sigmoidoscopy):  Excessive amounts of blood in the stool  Significant tenderness or worsening of abdominal pains  Swelling of the abdomen that is new, acute  Fever of 100F or higher   For urgent or emergent issues, a gastroenterologist can be reached at any hour by calling [phone].   DIET:  We do recommend a small meal at first, but then you may proceed to your regular diet.  Drink plenty of fluids but you should avoid alcoholic beverages for 24 hours.  ACTIVITY:  You should plan to take it easy for the rest of today and you should NOT DRIVE or use heavy machinery until tomorrow (because of the sedation medicines used during the test).     FOLLOW UP: Our staff will call the number listed on your records the next business day following your procedure to check on you and address any questions or concerns that you may have regarding the information given to you following your procedure. If we do not reach you, we will leave a message.  However, if you are feeling well and you are not experiencing any problems, there is no need to return our call.  We will assume that you have returned to your regular daily activities without incident.  If any biopsies were taken you will be contacted by phone or by letter within the next 1-3 weeks.  Please call us at [phone] if you have not heard about the biopsies in 3 weeks.    SIGNATURES/CONFIDENTIALITY: You and/or your care partner have signed paperwork which will be entered into your electronic medical record.  These signatures attest to the fact that that the information above on your After Visit Summary has been reviewed and is understood.  Full responsibility of the confidentiality of this discharge information lies with you and/or your care-partner.   Handouts were given to your care partner on polyps and hemorrhoids. NO ASPIRIN, ASPIRIN CONTAINING PRODUCTS (BC OR GOODY POWDERS) OR NSAIDS (IBUPROFEN, ADVIL, ALEVE, AND MOTRIN) FOR 2 weeks TYLENOL IS OK TO TAKE. You may resume your other current medications today. Await biopsy results. Please call if any questions or concerns.

## 2018-06-29 NOTE — Op Note (Signed)
[place] Patient Name: Leslie Werner Procedure Date: 06/29/2018 9:49 AM MRN: [id number] Endoscopist: Mauri Pole , MD Age: 67 Referring MD:  Date of Birth: [birth year] Gender: Female Account #: [id number] Procedure:                Colonoscopy Indications:              Evaluation of unexplained GI bleeding Medicines:                Monitored Anesthesia Care Procedure:                Pre-Anesthesia Assessment:                           - Prior to the procedure, a History and Physical                            was performed, and patient medications and                            allergies were reviewed. The patient's tolerance of                            previous anesthesia was also reviewed. The risks                            and benefits of the procedure and the sedation                            options and risks were discussed with the patient.                            All questions were answered, and informed consent                            was obtained. Prior Anticoagulants: The patient has                            taken no previous anticoagulant or antiplatelet                            agents. ASA Grade Assessment: II - A patient with                            mild systemic disease. After reviewing the risks                            and benefits, the patient was deemed in                            satisfactory condition to undergo the procedure.                           After obtaining informed consent, the colonoscope  was passed under direct vision. Throughout the                            procedure, the patient's blood pressure, pulse, and                            oxygen saturations were monitored continuously. The                            Colonoscope was introduced through the anus and                            advanced to the the terminal ileum, with                            identification of the  appendiceal orifice and IC                            valve. The colonoscopy was performed without                            difficulty. The patient tolerated the procedure                            well. The quality of the bowel preparation was                            good. The terminal ileum, ileocecal valve,                            appendiceal orifice, and rectum were photographed. Scope In: 10:01:40 AM Scope Out: 10:28:13 AM Scope Withdrawal Time: 0 hours 20 minutes 32 seconds  Total Procedure Duration: 0 hours 26 minutes 33 seconds  Findings:                 The perianal and digital rectal examinations were                            normal.                           Two flat polyps were found in the transverse colon                            and ascending colon. The polyps were 9 to 12 mm in                            size. These polyps were removed with a cold snare.                            Resection and retrieval were complete.                           A 14 mm polyp was found in the sigmoid  colon. The                            polyp was pedunculated. The polyp was removed with                            a hot snare. Resection and retrieval were complete.                           A localized area of moderately nodular mucosa with                            superficial ulceration and stigmata of recent                            bleeding was found at the anus. Biopsies were taken                            with a cold forceps for histology. Coagulation for                            hemostasis using snare tip was successful.                           Non-bleeding internal hemorrhoids were found during                            retroflexion. The hemorrhoids were medium-sized. Complications:            No immediate complications. Estimated Blood Loss:     Estimated blood loss was minimal. Impression:               - Two 9 to 12 mm polyps in the transverse colon and                             in the ascending colon, removed with a cold snare.                            Resected and retrieved.                           - One 14 mm polyp in the sigmoid colon, removed                            with a hot snare. Resected and retrieved.                           - Nodular mucosa at the anus. Biopsied. Treated                            with a hot snare.                           - Non-bleeding internal hemorrhoids. Recommendation:           -  Patient has a contact number available for                            emergencies. The signs and symptoms of potential                            delayed complications were discussed with the                            patient. Return to normal activities tomorrow.                            Written discharge instructions were provided to the                            patient.                           - Resume previous diet.                           - Continue present medications.                           - No high dose aspirin, ibuprofen, naproxen, or                            other non-steroidal anti-inflammatory drugs for 2                            weeks.                           - Await pathology results.                           - Repeat colonoscopy date to be determined after                            pending pathology results are reviewed for                            surveillance based on pathology results. Mauri Pole, MD 06/29/2018 10:38:26 AM This report has been signed electronically.

## 2018-06-29 NOTE — Progress Notes (Signed)
Pt asked if she can take her asa 81 mg daily.  Per Dr. Silverio Decamp yes continue asa 81 mg.  Pt is aware. maw

## 2018-06-29 NOTE — Progress Notes (Signed)
Report to PACU, RN, vss, BBS= Clear.  

## 2018-06-29 NOTE — Progress Notes (Addendum)
Per Dr. Silverio Decamp path was sent [facility].  She will call pt with results with in a couple of days.  No problems noted in the recovery room. maw

## 2018-06-30 ENCOUNTER — Telehealth: Payer: Self-pay | Admitting: *Deleted

## 2018-06-30 DIAGNOSIS — M48061 Spinal stenosis, lumbar region without neurogenic claudication: Secondary | ICD-10-CM | POA: Diagnosis not present

## 2018-06-30 DIAGNOSIS — M5136 Other intervertebral disc degeneration, lumbar region: Secondary | ICD-10-CM | POA: Diagnosis not present

## 2018-06-30 DIAGNOSIS — M4800 Spinal stenosis, site unspecified: Secondary | ICD-10-CM | POA: Diagnosis not present

## 2018-06-30 DIAGNOSIS — M5126 Other intervertebral disc displacement, lumbar region: Secondary | ICD-10-CM | POA: Diagnosis not present

## 2018-06-30 NOTE — Telephone Encounter (Signed)
  Follow up Call-  Call back number 06/29/2018  Post procedure Call Back phone  # [phone]  Permission to leave phone message Yes     Patient questions:  Do you have a fever, pain , or abdominal swelling? No. Pain Score  0 *  Have you tolerated food without any problems? Yes.    Have you been able to return to your normal activities? Yes.    Do you have any questions about your discharge instructions: Diet   No. Medications  No. Follow up visit  No.  Do you have questions or concerns about your Care? No.  Actions: * If pain score is 4 or above: No action needed, pain <4.

## 2018-07-01 ENCOUNTER — Telehealth: Payer: Self-pay | Admitting: Gastroenterology

## 2018-07-01 NOTE — Telephone Encounter (Signed)
She did call back and I spoke with her but did not give her the results.  She states that the best time to reach her tomorrow is after 2:00 pm on her cell/mobile.

## 2018-07-02 ENCOUNTER — Telehealth: Payer: Self-pay | Admitting: Gastroenterology

## 2018-07-02 NOTE — Telephone Encounter (Signed)
Patient is calling for test results.  Please call her when you are available.

## 2018-07-02 NOTE — Telephone Encounter (Signed)
Called patient and informed results. Please send referral to Dr Marcello Moores at [place]. Please see result note for details. Patient requested that we call her on cell phone going forward. Thanks

## 2018-07-05 ENCOUNTER — Telehealth: Payer: Self-pay

## 2018-07-05 NOTE — Telephone Encounter (Signed)
Referral sent to [facility].  I left a message for the patient to let her know they will be contacting her to schedule an appointment.

## 2018-07-15 DIAGNOSIS — E669 Obesity, unspecified: Secondary | ICD-10-CM | POA: Diagnosis not present

## 2018-07-15 DIAGNOSIS — Z6834 Body mass index (BMI) 34.0-34.9, adult: Secondary | ICD-10-CM | POA: Diagnosis not present

## 2018-07-15 DIAGNOSIS — K625 Hemorrhage of anus and rectum: Secondary | ICD-10-CM | POA: Diagnosis not present

## 2018-07-15 DIAGNOSIS — K529 Noninfective gastroenteritis and colitis, unspecified: Secondary | ICD-10-CM | POA: Diagnosis not present

## 2018-07-19 ENCOUNTER — Ambulatory Visit: Payer: Self-pay | Admitting: General Surgery

## 2018-07-19 DIAGNOSIS — K629 Disease of anus and rectum, unspecified: Secondary | ICD-10-CM | POA: Diagnosis not present

## 2018-07-19 NOTE — H&P (Signed)
History of Present Illness Leighton Ruff MD; 4/31/5400 3:19 PM) The patient is a 67 year old female who presents with a complaint of anal problems. 67 yo F who presents to the office for a consultation requested by Dr Silverio Decamp. The patient states that she has been having consistent rectal bleeding for ~3-4 months. Dr Silverio Decamp recommend a colonoscopy. A polyp was removed and she was noted to have some nodularity at her anal verge. Biopsies show some glandular atypia. Patient reports she has a h/o IBS-D and has recently started [facility] which has helped her bowel habits quite a bit.    Past Surgical History Mammie Lorenzo, LPN; 8/67/6195 0:93 PM) Appendectomy Colon Polyp Removal - Colonoscopy Hysterectomy (not due to cancer) - Partial  Diagnostic Studies History Mammie Lorenzo, LPN; 2/67/1245 8:09 PM) Mammogram within last year  Allergies Mammie Lorenzo, LPN; 9/83/3825 0:53 PM) Morphine Derivatives Itching. Voltaren-XR *ANALGESICS - ANTI-INFLAMMATORY* PO ONLY  Medication History Mammie Lorenzo, LPN; 9/76/7341 9:37 PM) Colesevelam HCl (625MG  Tablet, Oral) Active. Diclofenac Sodium (1% Gel, Transdermal) Active. Nortriptyline HCl (10MG  Capsule, Oral) Active. Amlodipine Besy-Benazepril HCl (10-40MG  Capsule, Oral) Active. Oxycodone-Acetaminophen (5-325MG  Tablet, Oral) Active. Aspirin (81MG  Tablet, Oral) Active. Medications Reconciled  Social History Mammie Lorenzo, LPN; 07/26/4096 3:53 PM) Alcohol use Occasional alcohol use. No caffeine use No drug use Tobacco use Former smoker.  Family History Mammie Lorenzo, LPN; 2/99/2426 8:34 PM) Alcohol Abuse Mother. Breast Cancer Family Members In General, Mother. Diabetes Mellitus Mother. Heart Disease Family Members In General. Hypertension Mother. Prostate Cancer Father. Respiratory Condition Father.  Pregnancy / Birth History Mammie Lorenzo, LPN; 1/96/2229 7:98 PM) Age at menarche 10  years. Contraceptive History Oral contraceptives. Gravida 3 Maternal age 89-20 Para 3  Other Problems Mammie Lorenzo, LPN; 08/14/1940 7:40 PM) Arthritis Back Pain Cholelithiasis Gastroesophageal Reflux Disease Hemorrhoids High blood pressure Kidney Stone Migraine Headache Thyroid Disease     Review of Systems Claiborne Billings [facility] LPN; 07/07/4817 5:63 PM) General Present- Fatigue. Not Present- Appetite Loss, Chills, Fever, Night Sweats, Weight Gain and Weight Loss. Skin Not Present- Change in Wart/Mole, Dryness, Hives, Jaundice, New Lesions, Non-Healing Wounds, Rash and Ulcer. HEENT Present- Wears glasses/contact lenses. Not Present- Earache, Hearing Loss, Hoarseness, Nose Bleed, Oral Ulcers, Ringing in the Ears, Seasonal Allergies, Sinus Pain, Sore Throat, Visual Disturbances and Yellow Eyes. Respiratory Not Present- Bloody sputum, Chronic Cough, Difficulty Breathing, Snoring and Wheezing. Breast Not Present- Breast Mass, Breast Pain, Nipple Discharge and Skin Changes. Cardiovascular Present- Leg Cramps and Shortness of Breath. Not Present- Chest Pain, Difficulty Breathing Lying Down, Palpitations, Rapid Heart Rate and Swelling of Extremities. Gastrointestinal Present- Bloating, Hemorrhoids and Nausea. Not Present- Abdominal Pain, Bloody Stool, Change in Bowel Habits, Chronic diarrhea, Constipation, Difficulty Swallowing, Excessive gas, Gets full quickly at meals, Indigestion, Rectal Pain and Vomiting. Female Genitourinary Not Present- Frequency, Nocturia, Painful Urination, Pelvic Pain and Urgency. Musculoskeletal Present- Back Pain, Joint Pain and Muscle Pain. Not Present- Joint Stiffness, Muscle Weakness and Swelling of Extremities. Neurological Present- Headaches, Tingling and Weakness. Not Present- Decreased Memory, Fainting, Numbness, Seizures, Tremor and Trouble walking. Psychiatric Not Present- Anxiety, Bipolar, Change in Sleep Pattern, Depression, Fearful and Frequent  crying. Endocrine Not Present- Cold Intolerance, Excessive Hunger, Hair Changes, Heat Intolerance, Hot flashes and New Diabetes. Hematology Not Present- Blood Thinners, Easy Bruising, Excessive bleeding, Gland problems, HIV and Persistent Infections.  Vitals Claiborne Billings [employer] LPN; 1/49/7026 3:78 PM) 07/19/2018 2:50 PM Weight: 216.2 lb Height: 65in Body Surface Area: 2.04 m Body Mass Index: 35.98 kg/m  Temp.: 97.37F(Oral)  Pulse: 105 (Regular)  BP: 142/90 (Sitting, Left Arm, Standard)      Physical Exam Leighton Ruff MD; 0/35/4656 3:20 PM)  General Mental Status-Alert. General Appearance-Not in acute distress. Build & Nutrition-Well nourished. Posture-Normal posture. Gait-Normal.  Head and Neck Head-normocephalic, atraumatic with no lesions or palpable masses. Trachea-midline.  Chest and Lung Exam Chest and lung exam reveals -on auscultation, normal breath sounds, no adventitious sounds and normal vocal resonance.  Cardiovascular Cardiovascular examination reveals -normal heart sounds, regular rate and rhythm with no murmurs and no digital clubbing, cyanosis, edema, increased warmth or tenderness.  Abdomen Inspection Inspection of the abdomen reveals - No Hernias. Palpation/Percussion Palpation and Percussion of the abdomen reveal - Soft, Non Tender, No Rigidity (guarding), No hepatosplenomegaly and No Palpable abdominal masses.  Rectal Anorectal Exam External - skin tag. Internal - decreased sphincter tone(mild). Proctoscopic exam -Note:mild hemorrhoid disease, unable to visualize lesion due to large amount of soft stool in rectal vault.   Neurologic Neurologic evaluation reveals -alert and oriented x 3 with no impairment of recent or remote memory, normal attention span and ability to concentrate, normal sensation and normal coordination.  Musculoskeletal Normal Exam - Bilateral-Upper Extremity Strength Normal and Lower  Extremity Strength Normal.    Assessment & Plan Leighton Ruff MD; 07/05/7516 4:12 PM)  ANAL LESION (K62.9) Impression: 67 yo F with atypical cells on biopsy of anal lesion. Most likely this is a prolapsing hemorrhoid but I am unable to visualize well in the office. I have recommended anal EUA with repeat biopsies and possible hemorrhoidal pexy. Risks include bleeding, pain and recurrence.

## 2018-07-19 NOTE — H&P (View-Only) (Signed)
History of Present Illness Leslie Ruff MD; 5/63/1497 3:19 PM) The patient is a 67 year old female who presents with a complaint of anal problems. 67 yo F who presents to the office for a consultation requested by Dr Silverio Decamp. The patient states that she has been having consistent rectal bleeding for ~3-4 months. Dr Silverio Decamp recommend a colonoscopy. A polyp was removed and she was noted to have some nodularity at her anal verge. Biopsies show some glandular atypia. Patient reports she has a h/o IBS-D and has recently started [facility] which has helped her bowel habits quite a bit.    Past Surgical History Mammie Lorenzo, LPN; 0/26/3785 8:85 PM) Appendectomy Colon Polyp Removal - Colonoscopy Hysterectomy (not due to cancer) - Partial  Diagnostic Studies History Mammie Lorenzo, LPN; 0/27/7412 8:78 PM) Mammogram within last year  Allergies Mammie Lorenzo, LPN; 6/76/7209 4:70 PM) Morphine Derivatives Itching. Voltaren-XR *ANALGESICS - ANTI-INFLAMMATORY* PO ONLY  Medication History Mammie Lorenzo, LPN; 9/62/8366 2:94 PM) Colesevelam HCl (625MG  Tablet, Oral) Active. Diclofenac Sodium (1% Gel, Transdermal) Active. Nortriptyline HCl (10MG  Capsule, Oral) Active. Amlodipine Besy-Benazepril HCl (10-40MG  Capsule, Oral) Active. Oxycodone-Acetaminophen (5-325MG  Tablet, Oral) Active. Aspirin (81MG  Tablet, Oral) Active. Medications Reconciled  Social History Mammie Lorenzo, LPN; 7/65/4650 3:54 PM) Alcohol use Occasional alcohol use. No caffeine use No drug use Tobacco use Former smoker.  Family History Mammie Lorenzo, LPN; 6/56/8127 5:17 PM) Alcohol Abuse Mother. Breast Cancer Family Members In General, Mother. Diabetes Mellitus Mother. Heart Disease Family Members In General. Hypertension Mother. Prostate Cancer Father. Respiratory Condition Father.  Pregnancy / Birth History Mammie Lorenzo, LPN; 0/11/7492 4:96 PM) Age at menarche 81  years. Contraceptive History Oral contraceptives. Gravida 3 Maternal age 27-20 Para 3  Other Problems Mammie Lorenzo, LPN; 7/59/1638 4:66 PM) Arthritis Back Pain Cholelithiasis Gastroesophageal Reflux Disease Hemorrhoids High blood pressure Kidney Stone Migraine Headache Thyroid Disease     Review of Systems Claiborne Billings [facility] LPN; 5/99/3570 1:77 PM) General Present- Fatigue. Not Present- Appetite Loss, Chills, Fever, Night Sweats, Weight Gain and Weight Loss. Skin Not Present- Change in Wart/Mole, Dryness, Hives, Jaundice, New Lesions, Non-Healing Wounds, Rash and Ulcer. HEENT Present- Wears glasses/contact lenses. Not Present- Earache, Hearing Loss, Hoarseness, Nose Bleed, Oral Ulcers, Ringing in the Ears, Seasonal Allergies, Sinus Pain, Sore Throat, Visual Disturbances and Yellow Eyes. Respiratory Not Present- Bloody sputum, Chronic Cough, Difficulty Breathing, Snoring and Wheezing. Breast Not Present- Breast Mass, Breast Pain, Nipple Discharge and Skin Changes. Cardiovascular Present- Leg Cramps and Shortness of Breath. Not Present- Chest Pain, Difficulty Breathing Lying Down, Palpitations, Rapid Heart Rate and Swelling of Extremities. Gastrointestinal Present- Bloating, Hemorrhoids and Nausea. Not Present- Abdominal Pain, Bloody Stool, Change in Bowel Habits, Chronic diarrhea, Constipation, Difficulty Swallowing, Excessive gas, Gets full quickly at meals, Indigestion, Rectal Pain and Vomiting. Female Genitourinary Not Present- Frequency, Nocturia, Painful Urination, Pelvic Pain and Urgency. Musculoskeletal Present- Back Pain, Joint Pain and Muscle Pain. Not Present- Joint Stiffness, Muscle Weakness and Swelling of Extremities. Neurological Present- Headaches, Tingling and Weakness. Not Present- Decreased Memory, Fainting, Numbness, Seizures, Tremor and Trouble walking. Psychiatric Not Present- Anxiety, Bipolar, Change in Sleep Pattern, Depression, Fearful and Frequent  crying. Endocrine Not Present- Cold Intolerance, Excessive Hunger, Hair Changes, Heat Intolerance, Hot flashes and New Diabetes. Hematology Not Present- Blood Thinners, Easy Bruising, Excessive bleeding, Gland problems, HIV and Persistent Infections.  Vitals Claiborne Billings Dockery LPN; 9/39/0300 9:23 PM) 07/19/2018 2:50 PM Weight: 216.2 lb Height: 65in Body Surface Area: 2.04 m Body Mass Index: 35.98 kg/m  Temp.: 97.9F(Oral)  Pulse: 105 (Regular)  BP: 142/90 (Sitting, Left Arm, Standard)      Physical Exam Leslie Ruff MD; 4/81/8563 3:20 PM)  General Mental Status-Alert. General Appearance-Not in acute distress. Build & Nutrition-Well nourished. Posture-Normal posture. Gait-Normal.  Head and Neck Head-normocephalic, atraumatic with no lesions or palpable masses. Trachea-midline.  Chest and Lung Exam Chest and lung exam reveals -on auscultation, normal breath sounds, no adventitious sounds and normal vocal resonance.  Cardiovascular Cardiovascular examination reveals -normal heart sounds, regular rate and rhythm with no murmurs and no digital clubbing, cyanosis, edema, increased warmth or tenderness.  Abdomen Inspection Inspection of the abdomen reveals - No Hernias. Palpation/Percussion Palpation and Percussion of the abdomen reveal - Soft, Non Tender, No Rigidity (guarding), No hepatosplenomegaly and No Palpable abdominal masses.  Rectal Anorectal Exam External - skin tag. Internal - decreased sphincter tone(mild). Proctoscopic exam -Note:mild hemorrhoid disease, unable to visualize lesion due to large amount of soft stool in rectal vault.   Neurologic Neurologic evaluation reveals -alert and oriented x 3 with no impairment of recent or remote memory, normal attention span and ability to concentrate, normal sensation and normal coordination.  Musculoskeletal Normal Exam - Bilateral-Upper Extremity Strength Normal and Lower  Extremity Strength Normal.    Assessment & Plan Leslie Ruff MD; 1/49/7026 4:12 PM)  ANAL LESION (K62.9) Impression: 67 yo F with atypical cells on biopsy of anal lesion. Most likely this is a prolapsing hemorrhoid but I am unable to visualize well in the office. I have recommended anal EUA with repeat biopsies and possible hemorrhoidal pexy. Risks include bleeding, pain and recurrence.

## 2018-07-20 DIAGNOSIS — M47816 Spondylosis without myelopathy or radiculopathy, lumbar region: Secondary | ICD-10-CM | POA: Diagnosis not present

## 2018-07-20 DIAGNOSIS — G894 Chronic pain syndrome: Secondary | ICD-10-CM | POA: Diagnosis not present

## 2018-07-20 DIAGNOSIS — G629 Polyneuropathy, unspecified: Secondary | ICD-10-CM | POA: Diagnosis not present

## 2018-07-20 DIAGNOSIS — M5136 Other intervertebral disc degeneration, lumbar region: Secondary | ICD-10-CM | POA: Diagnosis not present

## 2018-07-20 DIAGNOSIS — M545 Low back pain: Secondary | ICD-10-CM | POA: Diagnosis not present

## 2018-07-20 DIAGNOSIS — E663 Overweight: Secondary | ICD-10-CM | POA: Diagnosis not present

## 2018-07-20 DIAGNOSIS — M48061 Spinal stenosis, lumbar region without neurogenic claudication: Secondary | ICD-10-CM | POA: Diagnosis not present

## 2018-07-28 ENCOUNTER — Other Ambulatory Visit: Payer: Self-pay

## 2018-07-28 ENCOUNTER — Encounter (HOSPITAL_BASED_OUTPATIENT_CLINIC_OR_DEPARTMENT_OTHER): Payer: Self-pay

## 2018-07-28 NOTE — Progress Notes (Signed)
Spoke with:  Leslie Werner NPO:  After Midnight, no gum, candy, or mints   Arrival time: 0530AM Labs: Istat 8, EKG AM medications: Pepcid, Lyrica Pre op orders: Yes Ride home:  Jori Moll (husband) [phone]

## 2018-08-02 DIAGNOSIS — Z139 Encounter for screening, unspecified: Secondary | ICD-10-CM | POA: Diagnosis not present

## 2018-08-02 DIAGNOSIS — M549 Dorsalgia, unspecified: Secondary | ICD-10-CM | POA: Diagnosis not present

## 2018-08-02 DIAGNOSIS — Z6835 Body mass index (BMI) 35.0-35.9, adult: Secondary | ICD-10-CM | POA: Diagnosis not present

## 2018-08-03 ENCOUNTER — Encounter (HOSPITAL_BASED_OUTPATIENT_CLINIC_OR_DEPARTMENT_OTHER)
Admission: RE | Disposition: A | Discharge status: Home / Self Care | Payer: Self-pay | Source: Ambulatory Visit | Attending: General Surgery

## 2018-08-03 ENCOUNTER — Encounter (HOSPITAL_BASED_OUTPATIENT_CLINIC_OR_DEPARTMENT_OTHER): Payer: Self-pay | Admitting: *Deleted

## 2018-08-03 ENCOUNTER — Other Ambulatory Visit: Payer: Self-pay

## 2018-08-03 ENCOUNTER — Ambulatory Visit (HOSPITAL_BASED_OUTPATIENT_CLINIC_OR_DEPARTMENT_OTHER)
Admission: RE | Admit: 2018-08-03 | Discharge: 2018-08-03 | Disposition: A | Discharge status: Home / Self Care | Payer: Medicare HMO | Source: Ambulatory Visit | Attending: General Surgery | Admitting: General Surgery

## 2018-08-03 ENCOUNTER — Ambulatory Visit (HOSPITAL_BASED_OUTPATIENT_CLINIC_OR_DEPARTMENT_OTHER): Payer: Medicare HMO | Admitting: Anesthesiology

## 2018-08-03 DIAGNOSIS — Z7982 Long term (current) use of aspirin: Secondary | ICD-10-CM | POA: Insufficient documentation

## 2018-08-03 DIAGNOSIS — K641 Second degree hemorrhoids: Secondary | ICD-10-CM | POA: Diagnosis not present

## 2018-08-03 DIAGNOSIS — E7800 Pure hypercholesterolemia, unspecified: Secondary | ICD-10-CM | POA: Diagnosis not present

## 2018-08-03 DIAGNOSIS — Z87891 Personal history of nicotine dependence: Secondary | ICD-10-CM | POA: Diagnosis not present

## 2018-08-03 DIAGNOSIS — K219 Gastro-esophageal reflux disease without esophagitis: Secondary | ICD-10-CM | POA: Diagnosis not present

## 2018-08-03 DIAGNOSIS — Z79899 Other long term (current) drug therapy: Secondary | ICD-10-CM | POA: Insufficient documentation

## 2018-08-03 DIAGNOSIS — I10 Essential (primary) hypertension: Secondary | ICD-10-CM | POA: Insufficient documentation

## 2018-08-03 DIAGNOSIS — E039 Hypothyroidism, unspecified: Secondary | ICD-10-CM | POA: Insufficient documentation

## 2018-08-03 DIAGNOSIS — K648 Other hemorrhoids: Secondary | ICD-10-CM | POA: Diagnosis not present

## 2018-08-03 HISTORY — DX: Pure hyperglyceridemia: E78.1

## 2018-08-03 HISTORY — DX: Other intervertebral disc degeneration, lumbar region: M51.36

## 2018-08-03 HISTORY — DX: Fecal urgency: R15.9

## 2018-08-03 HISTORY — DX: Polyneuropathy, unspecified: G62.9

## 2018-08-03 HISTORY — DX: Dorsalgia, unspecified: M54.9

## 2018-08-03 HISTORY — DX: Presence of spectacles and contact lenses: Z97.3

## 2018-08-03 HISTORY — DX: Mononeuropathy, unspecified: G58.9

## 2018-08-03 HISTORY — PX: HEMORRHOID SURGERY: SHX153

## 2018-08-03 HISTORY — DX: Disease of anus and rectum, unspecified: K62.9

## 2018-08-03 HISTORY — DX: Cyst of spleen: D73.4

## 2018-08-03 HISTORY — PX: RECTAL EXAM UNDER ANESTHESIA: SHX6399

## 2018-08-03 HISTORY — DX: Full incontinence of feces: R15.2

## 2018-08-03 HISTORY — DX: Other intervertebral disc degeneration, lumbar region without mention of lumbar back pain or lower extremity pain: M51.369

## 2018-08-03 HISTORY — DX: Personal history of other diseases of the digestive system: Z87.19

## 2018-08-03 HISTORY — DX: Calculus of gallbladder without cholecystitis without obstruction: K80.20

## 2018-08-03 HISTORY — DX: Other hemorrhoids: K64.8

## 2018-08-03 HISTORY — DX: Other intervertebral disc displacement, lumbar region: M51.26

## 2018-08-03 HISTORY — DX: Spinal stenosis, site unspecified: M48.00

## 2018-08-03 HISTORY — DX: Personal history of colonic polyps: Z86.010

## 2018-08-03 HISTORY — DX: Irritable bowel syndrome, unspecified: K58.9

## 2018-08-03 HISTORY — DX: Personal history of urinary calculi: Z87.442

## 2018-08-03 LAB — POCT I-STAT, CHEM 8
BUN: 12 mg/dL (ref 8–23)
CALCIUM ION: 1.21 mmol/L (ref 1.15–1.40)
CREATININE: 0.6 mg/dL (ref 0.44–1.00)
Chloride: 104 mmol/L (ref 98–111)
Glucose, Bld: 109 mg/dL — ABNORMAL HIGH (ref 70–99)
HEMATOCRIT: 41 % (ref 36.0–46.0)
Hemoglobin: 13.9 g/dL (ref 12.0–15.0)
Potassium: 3.8 mmol/L (ref 3.5–5.1)
Sodium: 140 mmol/L (ref 135–145)
TCO2: 26 mmol/L (ref 22–32)

## 2018-08-03 SURGERY — EXAM UNDER ANESTHESIA, RECTUM
Anesthesia: Monitor Anesthesia Care | Site: Rectum

## 2018-08-03 MED ORDER — ACETAMINOPHEN 500 MG PO TABS
1000.0000 mg | ORAL_TABLET | ORAL | Status: AC
  Administered 2018-08-03: 1000 mg via ORAL
  Filled 2018-08-03: qty 2

## 2018-08-03 MED ORDER — DEXAMETHASONE SODIUM PHOSPHATE 10 MG/ML IJ SOLN
INTRAMUSCULAR | Status: AC
  Filled 2018-08-03: qty 1

## 2018-08-03 MED ORDER — LIDOCAINE 2% (20 MG/ML) 5 ML SYRINGE
INTRAMUSCULAR | Status: AC
  Filled 2018-08-03: qty 5

## 2018-08-03 MED ORDER — MEPERIDINE HCL 25 MG/ML IJ SOLN
6.2500 mg | INTRAMUSCULAR | Status: DC | PRN
  Filled 2018-08-03: qty 1

## 2018-08-03 MED ORDER — MIDAZOLAM HCL 2 MG/2ML IJ SOLN
INTRAMUSCULAR | Status: DC | PRN
  Administered 2018-08-03: 2 mg via INTRAVENOUS

## 2018-08-03 MED ORDER — GABAPENTIN 300 MG PO CAPS
ORAL_CAPSULE | ORAL | Status: AC
  Filled 2018-08-03: qty 1

## 2018-08-03 MED ORDER — ACETIC ACID 5 % SOLN
Status: DC | PRN
  Administered 2018-08-03: 1 via TOPICAL

## 2018-08-03 MED ORDER — ONDANSETRON HCL 4 MG/2ML IJ SOLN
INTRAMUSCULAR | Status: DC | PRN
  Administered 2018-08-03: 4 mg via INTRAVENOUS

## 2018-08-03 MED ORDER — OXYCODONE HCL 5 MG PO TABS
ORAL_TABLET | ORAL | Status: AC
  Filled 2018-08-03: qty 1

## 2018-08-03 MED ORDER — PROPOFOL 500 MG/50ML IV EMUL
INTRAVENOUS | Status: AC
  Filled 2018-08-03: qty 50

## 2018-08-03 MED ORDER — MIDAZOLAM HCL 2 MG/2ML IJ SOLN
INTRAMUSCULAR | Status: AC
  Filled 2018-08-03: qty 2

## 2018-08-03 MED ORDER — FENTANYL CITRATE (PF) 100 MCG/2ML IJ SOLN
INTRAMUSCULAR | Status: DC | PRN
  Administered 2018-08-03: 25 ug via INTRAVENOUS

## 2018-08-03 MED ORDER — PROPOFOL 500 MG/50ML IV EMUL
INTRAVENOUS | Status: DC | PRN
  Administered 2018-08-03: 200 ug/kg/min via INTRAVENOUS

## 2018-08-03 MED ORDER — SODIUM CHLORIDE 0.9% FLUSH
3.0000 mL | INTRAVENOUS | Status: DC | PRN
  Filled 2018-08-03: qty 3

## 2018-08-03 MED ORDER — DEXAMETHASONE SODIUM PHOSPHATE 10 MG/ML IJ SOLN
INTRAMUSCULAR | Status: DC | PRN
  Administered 2018-08-03: 5 mg via INTRAVENOUS

## 2018-08-03 MED ORDER — ACETAMINOPHEN 325 MG PO TABS
650.0000 mg | ORAL_TABLET | ORAL | Status: DC | PRN
  Filled 2018-08-03: qty 2

## 2018-08-03 MED ORDER — BUPIVACAINE-EPINEPHRINE 0.5% -1:200000 IJ SOLN
INTRAMUSCULAR | Status: DC | PRN
  Administered 2018-08-03: 30 mL

## 2018-08-03 MED ORDER — LACTATED RINGERS IV SOLN
INTRAVENOUS | Status: DC
  Administered 2018-08-03: 07:00:00 via INTRAVENOUS
  Filled 2018-08-03: qty 1000

## 2018-08-03 MED ORDER — SODIUM CHLORIDE 0.9% FLUSH
3.0000 mL | Freq: Two times a day (BID) | INTRAVENOUS | Status: DC
  Filled 2018-08-03: qty 3

## 2018-08-03 MED ORDER — OXYCODONE HCL 5 MG PO TABS
5.0000 mg | ORAL_TABLET | ORAL | Status: DC | PRN
  Administered 2018-08-03: 5 mg via ORAL
  Filled 2018-08-03: qty 2

## 2018-08-03 MED ORDER — ACETAMINOPHEN 650 MG RE SUPP
650.0000 mg | RECTAL | Status: DC | PRN
  Filled 2018-08-03: qty 1

## 2018-08-03 MED ORDER — SODIUM CHLORIDE 0.9 % IR SOLN
Status: DC | PRN
  Administered 2018-08-03: 500 mL

## 2018-08-03 MED ORDER — ONDANSETRON HCL 4 MG/2ML IJ SOLN
INTRAMUSCULAR | Status: AC
  Filled 2018-08-03: qty 2

## 2018-08-03 MED ORDER — SODIUM CHLORIDE 0.9 % IV SOLN
250.0000 mL | INTRAVENOUS | Status: DC | PRN
  Filled 2018-08-03: qty 250

## 2018-08-03 MED ORDER — FENTANYL CITRATE (PF) 100 MCG/2ML IJ SOLN
INTRAMUSCULAR | Status: AC
  Filled 2018-08-03: qty 2

## 2018-08-03 MED ORDER — LACTATED RINGERS IV SOLN
INTRAVENOUS | Status: DC
  Filled 2018-08-03: qty 1000

## 2018-08-03 MED ORDER — LIDOCAINE 2% (20 MG/ML) 5 ML SYRINGE
INTRAMUSCULAR | Status: DC | PRN
  Administered 2018-08-03: 25 mg via INTRAVENOUS

## 2018-08-03 MED ORDER — GABAPENTIN 300 MG PO CAPS
300.0000 mg | ORAL_CAPSULE | ORAL | Status: DC
  Filled 2018-08-03: qty 1

## 2018-08-03 MED ORDER — METOCLOPRAMIDE HCL 5 MG/ML IJ SOLN
10.0000 mg | Freq: Once | INTRAMUSCULAR | Status: DC | PRN
  Filled 2018-08-03: qty 2

## 2018-08-03 MED ORDER — ACETAMINOPHEN 500 MG PO TABS
ORAL_TABLET | ORAL | Status: AC
  Filled 2018-08-03: qty 2

## 2018-08-03 MED ORDER — FENTANYL CITRATE (PF) 100 MCG/2ML IJ SOLN
25.0000 ug | INTRAMUSCULAR | Status: DC | PRN
  Filled 2018-08-03: qty 1

## 2018-08-03 SURGICAL SUPPLY — 58 items
BENZOIN TINCTURE PRP APPL 2/3 (GAUZE/BANDAGES/DRESSINGS) ×2 IMPLANT
BLADE EXTENDED COATED 6.5IN (ELECTRODE) IMPLANT
BLADE HEX COATED 2.75 (ELECTRODE) ×2 IMPLANT
BLADE SURG 10 STRL SS (BLADE) IMPLANT
BLADE SURG 15 STRL LF DISP TIS (BLADE) ×1 IMPLANT
BLADE SURG 15 STRL SS (BLADE) ×1
BRIEF STRETCH FOR OB PAD LRG (UNDERPADS AND DIAPERS) ×4 IMPLANT
CANISTER SUCT 3000ML PPV (MISCELLANEOUS) ×2 IMPLANT
COVER BACK TABLE 60X90IN (DRAPES) ×2 IMPLANT
COVER MAYO STAND STRL (DRAPES) ×2 IMPLANT
DECANTER SPIKE VIAL GLASS SM (MISCELLANEOUS) ×2 IMPLANT
DRAPE LAPAROTOMY 100X72 PEDS (DRAPES) ×2 IMPLANT
DRAPE UTILITY XL STRL (DRAPES) ×2 IMPLANT
DRSG PAD ABDOMINAL 8X10 ST (GAUZE/BANDAGES/DRESSINGS) ×2 IMPLANT
ELECT REM PT RETURN 9FT ADLT (ELECTROSURGICAL) ×2
ELECTRODE REM PT RTRN 9FT ADLT (ELECTROSURGICAL) ×1 IMPLANT
GAUZE 4X4 16PLY RFD (DISPOSABLE) IMPLANT
GAUZE SPONGE 4X4 12PLY STRL (GAUZE/BANDAGES/DRESSINGS) ×2 IMPLANT
GAUZE SPONGE 4X4 12PLY STRL LF (GAUZE/BANDAGES/DRESSINGS) ×2 IMPLANT
GLOVE BIO SURGEON STRL SZ 6.5 (GLOVE) ×4 IMPLANT
GLOVE BIOGEL PI IND STRL 6.5 (GLOVE) ×1 IMPLANT
GLOVE BIOGEL PI IND STRL 7.0 (GLOVE) ×1 IMPLANT
GLOVE BIOGEL PI IND STRL 7.5 (GLOVE) ×1 IMPLANT
GLOVE BIOGEL PI INDICATOR 6.5 (GLOVE) ×1
GLOVE BIOGEL PI INDICATOR 7.0 (GLOVE) ×1
GLOVE BIOGEL PI INDICATOR 7.5 (GLOVE) ×1
GOWN SPEC L3 XXLG W/TWL (GOWN DISPOSABLE) ×2 IMPLANT
GOWN STRL REUS W/TWL 2XL LVL3 (GOWN DISPOSABLE) ×2 IMPLANT
GOWN STRL REUS W/TWL LRG LVL3 (GOWN DISPOSABLE) ×2 IMPLANT
HYDROGEN PEROXIDE 16OZ (MISCELLANEOUS) IMPLANT
KIT TURNOVER CYSTO (KITS) ×2 IMPLANT
LOOP VESSEL MAXI BLUE (MISCELLANEOUS) IMPLANT
NEEDLE HYPO 22GX1.5 SAFETY (NEEDLE) ×2 IMPLANT
NS IRRIG 500ML POUR BTL (IV SOLUTION) ×2 IMPLANT
PACK BASIN DAY SURGERY FS (CUSTOM PROCEDURE TRAY) ×2 IMPLANT
PAD ABD 8X10 STRL (GAUZE/BANDAGES/DRESSINGS) ×2 IMPLANT
PAD ARMBOARD 7.5X6 YLW CONV (MISCELLANEOUS) ×2 IMPLANT
PENCIL BUTTON HOLSTER BLD 10FT (ELECTRODE) ×2 IMPLANT
SPONGE HEMORRHOID 8X3CM (HEMOSTASIS) IMPLANT
SPONGE SURGIFOAM ABS GEL 100 (HEMOSTASIS) IMPLANT
SPONGE SURGIFOAM ABS GEL 12-7 (HEMOSTASIS) IMPLANT
SUCTION FRAZIER HANDLE 10FR (MISCELLANEOUS)
SUCTION TUBE FRAZIER 10FR DISP (MISCELLANEOUS) IMPLANT
SUT CHROMIC 2 0 SH (SUTURE) ×2 IMPLANT
SUT CHROMIC 3 0 SH 27 (SUTURE) ×2 IMPLANT
SUT ETHIBOND 0 (SUTURE) IMPLANT
SUT VIC AB 2-0 SH 27 (SUTURE)
SUT VIC AB 2-0 SH 27XBRD (SUTURE) IMPLANT
SUT VIC AB 3-0 SH 18 (SUTURE) IMPLANT
SUT VIC AB 4-0 P-3 18XBRD (SUTURE) IMPLANT
SUT VIC AB 4-0 P3 18 (SUTURE)
SUT VIC AB 4-0 SH 18 (SUTURE) IMPLANT
SYR CONTROL 10ML LL (SYRINGE) ×2 IMPLANT
TOWEL OR 17X24 6PK STRL BLUE (TOWEL DISPOSABLE) ×2 IMPLANT
TRAY DSU PREP LF (CUSTOM PROCEDURE TRAY) ×2 IMPLANT
TUBE CONNECTING 12X1/4 (SUCTIONS) ×2 IMPLANT
WATER STERILE IRR 500ML POUR (IV SOLUTION) IMPLANT
YANKAUER SUCT BULB TIP NO VENT (SUCTIONS) ×2 IMPLANT

## 2018-08-03 NOTE — Anesthesia Preprocedure Evaluation (Addendum)
Anesthesia Evaluation  Patient identified by MRN, date of birth, ID band Patient awake    Reviewed: Allergy & Precautions, NPO status , Patient's Chart, lab work & pertinent test results  Airway Mallampati: II  TM Distance: >3 FB Neck ROM: Full    Dental no notable dental hx.    Pulmonary former smoker,    Pulmonary exam normal breath sounds clear to auscultation       Cardiovascular hypertension, Pt. on medications Normal cardiovascular exam Rhythm:Regular Rate:Normal     Neuro/Psych negative neurological ROS  negative psych ROS   GI/Hepatic Neg liver ROS, GERD  Controlled,  Endo/Other  negative endocrine ROSHypothyroidism   Renal/GU negative Renal ROS  negative genitourinary   Musculoskeletal negative musculoskeletal ROS (+) Arthritis , Spinal stenosis   Abdominal   Peds negative pediatric ROS (+)  Hematology negative hematology ROS (+)   Anesthesia Other Findings Spinal stenosis  Reproductive/Obstetrics negative OB ROS                            Anesthesia Physical Anesthesia Plan  ASA: II  Anesthesia Plan: MAC   Post-op Pain Management:    Induction: Intravenous  PONV Risk Score and Plan: 2 and Ondansetron and Treatment may vary due to age or medical condition  Airway Management Planned: Simple Face Mask  Additional Equipment:   Intra-op Plan:   Post-operative Plan:   Informed Consent: I have reviewed the patients History and Physical, chart, labs and discussed the procedure including the risks, benefits and alternatives for the proposed anesthesia with the patient or authorized representative who has indicated his/her understanding and acceptance.   Dental advisory given  Plan Discussed with: CRNA  Anesthesia Plan Comments:         Anesthesia Quick Evaluation

## 2018-08-03 NOTE — Anesthesia Postprocedure Evaluation (Signed)
Anesthesia Post Note  Patient: Leslie Werner  Procedure(s) Performed: ANAL  EXAM UNDER ANESTHESIA (N/A Rectum) HEMORRHOIDAL PEXY (N/A Rectum)     Patient location during evaluation: PACU Anesthesia Type: MAC Level of consciousness: awake and alert Pain management: pain level controlled Vital Signs Assessment: post-procedure vital signs reviewed and stable Respiratory status: spontaneous breathing, nonlabored ventilation, respiratory function stable and patient connected to nasal cannula oxygen Cardiovascular status: stable and blood pressure returned to baseline Postop Assessment: no apparent nausea or vomiting Anesthetic complications: no    Last Vitals:  Vitals:   08/03/18 1045 08/03/18 1100  BP:  133/65  Pulse:  82  Resp:  13  Temp:  36.8 C  SpO2: 96% 97%    Last Pain:  Vitals:   08/03/18 1105  TempSrc:   PainSc: 3                  Montez Hageman

## 2018-08-03 NOTE — Op Note (Signed)
08/03/2018  7:56 AM  PATIENT:  Leslie Werner  67 y.o. female  Patient Care Team: Marco Collie, MD as PCP - General (Family Medicine)  PRE-OPERATIVE DIAGNOSIS:  anal lesion  POST-OPERATIVE DIAGNOSIS:  Internal hemorrhoid  PROCEDURE:  ANAL EXAM UNDER ANESTHESIA, HEMORRHOIDAL PEXY   Surgeon(s): Leighton Ruff, MD  ASSISTANT: none   ANESTHESIA:   local and MAC  SPECIMEN:  No Specimen  DISPOSITION OF SPECIMEN:  N/A  COUNTS:  YES  PLAN OF CARE: Discharge to home after PACU  PATIENT DISPOSITION:  PACU - hemodynamically stable.  INDICATION: 67 y.o. F with abnormal anal mucosa seen on colonoscopy, rectal bleeding   OR FINDINGS: normal appearing mucosa, grade 2 left posterior prolapse  DESCRIPTION: the patient was identified in the preoperative holding area and taken to the OR where they were laid on the operating room table.  MAC anesthesia was induced without difficulty. The patient was then positioned in prone jackknife position with buttocks gently taped apart.  The patient was then prepped and draped in usual sterile fashion.  SCDs were noted to be in place prior to the initiation of anesthesia. A surgical timeout was performed indicating the correct patient, procedure, positioning and need for preoperative antibiotics.  A rectal block was performed using Marcaine with epinephrine.    I began with a digital rectal exam.  Sphincter tone was decreased.  I then placed a Hill-Ferguson anoscope into the anal canal and evaluated this completely.  No abnormal mucosa was visualized.  The patient had some small prolapsing hemorrhoids in the left posterior and right posterior anal canal.  I then placed in acetic acid-soaked sponge into the anal canal and allowed this to sit for approximately 90 seconds.  Upon evaluation I still no abnormally staining areas concerning for AIN.  Given that the patient is having some bleeding, I decided to perform a hemorrhoidal pexy on the largest hemorrhoid  which was in the left posterior space.  This was done with a 3-0 Vicryl suture.  Additional Marcaine was placed underneath of this.  The patient was then awakened from anesthesia and sent to the postanesthesia care unit in stable condition.  All counts were correct per operating room staff.

## 2018-08-03 NOTE — Discharge Instructions (Addendum)
Beginning the day after surgery:  You may sit in a tub of warm water 2-3 times a day to relieve discomfort.  Eat a regular diet high in fiber.  Avoid foods that give you constipation or diarrhea.  Avoid foods that are difficult to digest, such as seeds, nuts, corn or popcorn.  Do not go any longer than 2 days without a bowel movement.  You may take a dose of Milk of Magnesia if you become constipated.    Drink 6-8 glasses of water daily.  Walking is encouraged.  Avoid strenuous activity and heavy lifting for one month after surgery.    Call the office if you have any questions or concerns.  Call immediately if you develop:   Excessive rectal bleeding (more than a cup or passing large clots)  Increased discomfort  Fever greater than 100 F  Difficulty urinating  Do not take any tylenol until after 12:00 pm today.     Post Anesthesia Home Care Instructions  Activity: Get plenty of rest for the remainder of the day. A responsible individual must stay with you for 24 hours following the procedure.  For the next 24 hours, DO NOT: -Drive a car -Paediatric nurse -Drink alcoholic beverages -Take any medication unless instructed by your physician -Make any legal decisions or sign important papers.  Meals: Start with liquid foods such as gelatin or soup. Progress to regular foods as tolerated. Avoid greasy, spicy, heavy foods. If nausea and/or vomiting occur, drink only clear liquids until the nausea and/or vomiting subsides. Call your physician if vomiting continues.  Special Instructions/Symptoms: Your throat may feel dry or sore from the anesthesia or the breathing tube placed in your throat during surgery. If this causes discomfort, gargle with warm salt water. The discomfort should disappear within 24 hours.

## 2018-08-03 NOTE — Interval H&P Note (Signed)
History and Physical Interval Note:  08/03/2018 6:59 AM  Leslie Werner  has presented today for surgery, with the diagnosis of anal lesion  The various methods of treatment have been discussed with the patient and family. After consideration of risks, benefits and other options for treatment, the patient has consented to  Procedure(s): ANAL  EXAM UNDER ANESTHESIA POSSIBLE BIOPSY (N/A) POSSIBLE HEMORRHOIDAL PEXY (N/A) as a surgical intervention .  The patient's history has been reviewed, patient examined, no change in status, stable for surgery.  I have reviewed the patient's chart and labs.  Questions were answered to the patient's satisfaction.     Rosario Adie, MD  [facility]

## 2018-08-03 NOTE — Anesthesia Procedure Notes (Signed)
Procedure Name: MAC Date/Time: 08/03/2018 7:30 AM Performed by: Wanita Chamberlain, CRNA Pre-anesthesia Checklist: Patient identified, Timeout performed, Emergency Drugs available, Suction available and Patient being monitored Patient Re-evaluated:Patient Re-evaluated prior to induction Oxygen Delivery Method: Nasal cannula Induction Type: IV induction

## 2018-08-03 NOTE — Transfer of Care (Signed)
Immediate Anesthesia Transfer of Care Note  Patient: Leslie Werner  Procedure(s) Performed: ANAL  EXAM UNDER ANESTHESIA POSSIBLE BIOPSY (N/A ) POSSIBLE HEMORRHOIDAL PEXY (N/A )  Patient Location: PACU  Anesthesia Type:MAC  Level of Consciousness: awake, alert , oriented and patient cooperative  Airway & Oxygen Therapy: Patient Spontanous Breathing and Patient connected to nasal cannula oxygen  Post-op Assessment: Report given to RN and Post -op Vital signs reviewed and stable  Post vital signs: Reviewed and stable  Last Vitals:  Vitals Value Taken Time  BP    Temp    Pulse 99 08/03/2018  8:07 AM  Resp    SpO2 93 % 08/03/2018  8:07 AM  Vitals shown include unvalidated device data.  Last Pain:  Vitals:   08/03/18 0609  TempSrc:   PainSc: 1       Patients Stated Pain Goal: 5 (50/93/26 [id number])  Complications: No apparent anesthesia complications

## 2018-08-04 ENCOUNTER — Encounter (HOSPITAL_BASED_OUTPATIENT_CLINIC_OR_DEPARTMENT_OTHER): Payer: Self-pay | Admitting: General Surgery

## 2018-08-09 DIAGNOSIS — Z6835 Body mass index (BMI) 35.0-35.9, adult: Secondary | ICD-10-CM | POA: Diagnosis not present

## 2018-08-09 DIAGNOSIS — Z23 Encounter for immunization: Secondary | ICD-10-CM | POA: Diagnosis not present

## 2018-08-09 DIAGNOSIS — M549 Dorsalgia, unspecified: Secondary | ICD-10-CM | POA: Diagnosis not present

## 2018-08-11 DIAGNOSIS — M48061 Spinal stenosis, lumbar region without neurogenic claudication: Secondary | ICD-10-CM | POA: Diagnosis not present

## 2018-08-11 DIAGNOSIS — M47816 Spondylosis without myelopathy or radiculopathy, lumbar region: Secondary | ICD-10-CM | POA: Diagnosis not present

## 2018-08-11 DIAGNOSIS — M545 Low back pain: Secondary | ICD-10-CM | POA: Diagnosis not present

## 2018-08-11 DIAGNOSIS — G629 Polyneuropathy, unspecified: Secondary | ICD-10-CM | POA: Diagnosis not present

## 2018-08-11 DIAGNOSIS — M5136 Other intervertebral disc degeneration, lumbar region: Secondary | ICD-10-CM | POA: Diagnosis not present

## 2018-08-11 DIAGNOSIS — G894 Chronic pain syndrome: Secondary | ICD-10-CM | POA: Diagnosis not present

## 2018-08-11 DIAGNOSIS — E663 Overweight: Secondary | ICD-10-CM | POA: Diagnosis not present

## 2018-08-20 DIAGNOSIS — E663 Overweight: Secondary | ICD-10-CM | POA: Diagnosis not present

## 2018-08-20 DIAGNOSIS — G894 Chronic pain syndrome: Secondary | ICD-10-CM | POA: Diagnosis not present

## 2018-08-20 DIAGNOSIS — M48061 Spinal stenosis, lumbar region without neurogenic claudication: Secondary | ICD-10-CM | POA: Diagnosis not present

## 2018-08-20 DIAGNOSIS — M47816 Spondylosis without myelopathy or radiculopathy, lumbar region: Secondary | ICD-10-CM | POA: Diagnosis not present

## 2018-08-20 DIAGNOSIS — M5136 Other intervertebral disc degeneration, lumbar region: Secondary | ICD-10-CM | POA: Diagnosis not present

## 2018-08-20 DIAGNOSIS — G629 Polyneuropathy, unspecified: Secondary | ICD-10-CM | POA: Diagnosis not present

## 2018-09-14 DIAGNOSIS — G894 Chronic pain syndrome: Secondary | ICD-10-CM | POA: Diagnosis not present

## 2018-09-14 DIAGNOSIS — M48061 Spinal stenosis, lumbar region without neurogenic claudication: Secondary | ICD-10-CM | POA: Diagnosis not present

## 2018-09-14 DIAGNOSIS — M5136 Other intervertebral disc degeneration, lumbar region: Secondary | ICD-10-CM | POA: Diagnosis not present

## 2018-09-14 DIAGNOSIS — M47816 Spondylosis without myelopathy or radiculopathy, lumbar region: Secondary | ICD-10-CM | POA: Diagnosis not present

## 2018-09-14 DIAGNOSIS — G629 Polyneuropathy, unspecified: Secondary | ICD-10-CM | POA: Diagnosis not present

## 2018-09-14 DIAGNOSIS — E663 Overweight: Secondary | ICD-10-CM | POA: Diagnosis not present

## 2018-10-19 DIAGNOSIS — M5136 Other intervertebral disc degeneration, lumbar region: Secondary | ICD-10-CM | POA: Diagnosis not present

## 2018-10-19 DIAGNOSIS — M48061 Spinal stenosis, lumbar region without neurogenic claudication: Secondary | ICD-10-CM | POA: Diagnosis not present

## 2018-10-19 DIAGNOSIS — G894 Chronic pain syndrome: Secondary | ICD-10-CM | POA: Diagnosis not present

## 2018-10-19 DIAGNOSIS — G629 Polyneuropathy, unspecified: Secondary | ICD-10-CM | POA: Diagnosis not present

## 2018-10-19 DIAGNOSIS — E663 Overweight: Secondary | ICD-10-CM | POA: Diagnosis not present

## 2018-10-19 DIAGNOSIS — M47816 Spondylosis without myelopathy or radiculopathy, lumbar region: Secondary | ICD-10-CM | POA: Diagnosis not present

## 2018-11-03 DIAGNOSIS — J22 Unspecified acute lower respiratory infection: Secondary | ICD-10-CM | POA: Diagnosis not present

## 2018-11-03 DIAGNOSIS — Z6836 Body mass index (BMI) 36.0-36.9, adult: Secondary | ICD-10-CM | POA: Diagnosis not present

## 2018-11-03 DIAGNOSIS — R509 Fever, unspecified: Secondary | ICD-10-CM | POA: Diagnosis not present

## 2018-11-25 DIAGNOSIS — M5136 Other intervertebral disc degeneration, lumbar region: Secondary | ICD-10-CM | POA: Diagnosis not present

## 2018-11-25 DIAGNOSIS — G629 Polyneuropathy, unspecified: Secondary | ICD-10-CM | POA: Diagnosis not present

## 2018-11-25 DIAGNOSIS — E663 Overweight: Secondary | ICD-10-CM | POA: Diagnosis not present

## 2018-11-25 DIAGNOSIS — G894 Chronic pain syndrome: Secondary | ICD-10-CM | POA: Diagnosis not present

## 2018-11-25 DIAGNOSIS — M48061 Spinal stenosis, lumbar region without neurogenic claudication: Secondary | ICD-10-CM | POA: Diagnosis not present

## 2018-11-25 DIAGNOSIS — M47816 Spondylosis without myelopathy or radiculopathy, lumbar region: Secondary | ICD-10-CM | POA: Diagnosis not present

## 2018-11-29 ENCOUNTER — Other Ambulatory Visit: Payer: Self-pay | Admitting: Gastroenterology

## 2018-12-22 DIAGNOSIS — M47816 Spondylosis without myelopathy or radiculopathy, lumbar region: Secondary | ICD-10-CM | POA: Diagnosis not present

## 2018-12-22 DIAGNOSIS — G894 Chronic pain syndrome: Secondary | ICD-10-CM | POA: Diagnosis not present

## 2018-12-22 DIAGNOSIS — E663 Overweight: Secondary | ICD-10-CM | POA: Diagnosis not present

## 2018-12-22 DIAGNOSIS — G629 Polyneuropathy, unspecified: Secondary | ICD-10-CM | POA: Diagnosis not present

## 2018-12-22 DIAGNOSIS — M5136 Other intervertebral disc degeneration, lumbar region: Secondary | ICD-10-CM | POA: Diagnosis not present

## 2018-12-22 DIAGNOSIS — M48061 Spinal stenosis, lumbar region without neurogenic claudication: Secondary | ICD-10-CM | POA: Diagnosis not present

## 2019-01-12 DIAGNOSIS — R69 Illness, unspecified: Secondary | ICD-10-CM | POA: Diagnosis not present

## 2019-01-20 DIAGNOSIS — M47816 Spondylosis without myelopathy or radiculopathy, lumbar region: Secondary | ICD-10-CM | POA: Diagnosis not present

## 2019-01-20 DIAGNOSIS — M5136 Other intervertebral disc degeneration, lumbar region: Secondary | ICD-10-CM | POA: Diagnosis not present

## 2019-01-20 DIAGNOSIS — G629 Polyneuropathy, unspecified: Secondary | ICD-10-CM | POA: Diagnosis not present

## 2019-01-20 DIAGNOSIS — E663 Overweight: Secondary | ICD-10-CM | POA: Diagnosis not present

## 2019-01-20 DIAGNOSIS — G894 Chronic pain syndrome: Secondary | ICD-10-CM | POA: Diagnosis not present

## 2019-01-20 DIAGNOSIS — M48061 Spinal stenosis, lumbar region without neurogenic claudication: Secondary | ICD-10-CM | POA: Diagnosis not present

## 2019-02-17 DIAGNOSIS — E663 Overweight: Secondary | ICD-10-CM | POA: Diagnosis not present

## 2019-02-17 DIAGNOSIS — M47816 Spondylosis without myelopathy or radiculopathy, lumbar region: Secondary | ICD-10-CM | POA: Diagnosis not present

## 2019-02-17 DIAGNOSIS — G629 Polyneuropathy, unspecified: Secondary | ICD-10-CM | POA: Diagnosis not present

## 2019-02-17 DIAGNOSIS — M5136 Other intervertebral disc degeneration, lumbar region: Secondary | ICD-10-CM | POA: Diagnosis not present

## 2019-02-17 DIAGNOSIS — M48061 Spinal stenosis, lumbar region without neurogenic claudication: Secondary | ICD-10-CM | POA: Diagnosis not present

## 2019-02-17 DIAGNOSIS — G894 Chronic pain syndrome: Secondary | ICD-10-CM | POA: Diagnosis not present

## 2019-03-16 DIAGNOSIS — G629 Polyneuropathy, unspecified: Secondary | ICD-10-CM | POA: Diagnosis not present

## 2019-03-16 DIAGNOSIS — G894 Chronic pain syndrome: Secondary | ICD-10-CM | POA: Diagnosis not present

## 2019-03-16 DIAGNOSIS — M48061 Spinal stenosis, lumbar region without neurogenic claudication: Secondary | ICD-10-CM | POA: Diagnosis not present

## 2019-03-16 DIAGNOSIS — M47816 Spondylosis without myelopathy or radiculopathy, lumbar region: Secondary | ICD-10-CM | POA: Diagnosis not present

## 2019-03-16 DIAGNOSIS — M5136 Other intervertebral disc degeneration, lumbar region: Secondary | ICD-10-CM | POA: Diagnosis not present

## 2019-03-16 DIAGNOSIS — E663 Overweight: Secondary | ICD-10-CM | POA: Diagnosis not present

## 2019-04-11 DIAGNOSIS — M47816 Spondylosis without myelopathy or radiculopathy, lumbar region: Secondary | ICD-10-CM | POA: Diagnosis not present

## 2019-04-11 DIAGNOSIS — M5136 Other intervertebral disc degeneration, lumbar region: Secondary | ICD-10-CM | POA: Diagnosis not present

## 2019-04-11 DIAGNOSIS — M48061 Spinal stenosis, lumbar region without neurogenic claudication: Secondary | ICD-10-CM | POA: Diagnosis not present

## 2019-04-11 DIAGNOSIS — E663 Overweight: Secondary | ICD-10-CM | POA: Diagnosis not present

## 2019-04-11 DIAGNOSIS — G894 Chronic pain syndrome: Secondary | ICD-10-CM | POA: Diagnosis not present

## 2019-04-11 DIAGNOSIS — G629 Polyneuropathy, unspecified: Secondary | ICD-10-CM | POA: Diagnosis not present

## 2019-04-28 DIAGNOSIS — E781 Pure hyperglyceridemia: Secondary | ICD-10-CM | POA: Diagnosis not present

## 2019-04-28 DIAGNOSIS — I10 Essential (primary) hypertension: Secondary | ICD-10-CM | POA: Diagnosis not present

## 2019-04-28 DIAGNOSIS — Z0000 Encounter for general adult medical examination without abnormal findings: Secondary | ICD-10-CM | POA: Diagnosis not present

## 2019-05-05 DIAGNOSIS — I10 Essential (primary) hypertension: Secondary | ICD-10-CM | POA: Diagnosis not present

## 2019-05-05 DIAGNOSIS — E781 Pure hyperglyceridemia: Secondary | ICD-10-CM | POA: Diagnosis not present

## 2019-05-05 DIAGNOSIS — Z1329 Encounter for screening for other suspected endocrine disorder: Secondary | ICD-10-CM | POA: Diagnosis not present

## 2019-05-09 DIAGNOSIS — M47816 Spondylosis without myelopathy or radiculopathy, lumbar region: Secondary | ICD-10-CM | POA: Diagnosis not present

## 2019-05-09 DIAGNOSIS — E663 Overweight: Secondary | ICD-10-CM | POA: Diagnosis not present

## 2019-05-09 DIAGNOSIS — M5136 Other intervertebral disc degeneration, lumbar region: Secondary | ICD-10-CM | POA: Diagnosis not present

## 2019-05-09 DIAGNOSIS — G894 Chronic pain syndrome: Secondary | ICD-10-CM | POA: Diagnosis not present

## 2019-05-09 DIAGNOSIS — G629 Polyneuropathy, unspecified: Secondary | ICD-10-CM | POA: Diagnosis not present

## 2019-05-09 DIAGNOSIS — M48061 Spinal stenosis, lumbar region without neurogenic claudication: Secondary | ICD-10-CM | POA: Diagnosis not present

## 2019-05-09 DIAGNOSIS — I8390 Asymptomatic varicose veins of unspecified lower extremity: Secondary | ICD-10-CM | POA: Diagnosis not present

## 2019-05-12 DIAGNOSIS — E781 Pure hyperglyceridemia: Secondary | ICD-10-CM | POA: Diagnosis not present

## 2019-05-12 DIAGNOSIS — E0590 Thyrotoxicosis, unspecified without thyrotoxic crisis or storm: Secondary | ICD-10-CM | POA: Diagnosis not present

## 2019-05-12 DIAGNOSIS — E538 Deficiency of other specified B group vitamins: Secondary | ICD-10-CM | POA: Diagnosis not present

## 2019-05-12 DIAGNOSIS — R7301 Impaired fasting glucose: Secondary | ICD-10-CM | POA: Diagnosis not present

## 2019-05-19 DIAGNOSIS — Z23 Encounter for immunization: Secondary | ICD-10-CM | POA: Diagnosis not present

## 2019-05-19 DIAGNOSIS — Z6836 Body mass index (BMI) 36.0-36.9, adult: Secondary | ICD-10-CM | POA: Diagnosis not present

## 2019-05-19 DIAGNOSIS — E0590 Thyrotoxicosis, unspecified without thyrotoxic crisis or storm: Secondary | ICD-10-CM | POA: Diagnosis not present

## 2019-05-23 DIAGNOSIS — Z20828 Contact with and (suspected) exposure to other viral communicable diseases: Secondary | ICD-10-CM | POA: Diagnosis not present

## 2019-05-23 DIAGNOSIS — Z03818 Encounter for observation for suspected exposure to other biological agents ruled out: Secondary | ICD-10-CM | POA: Diagnosis not present

## 2019-06-06 DIAGNOSIS — Z6836 Body mass index (BMI) 36.0-36.9, adult: Secondary | ICD-10-CM | POA: Diagnosis not present

## 2019-06-06 DIAGNOSIS — G629 Polyneuropathy, unspecified: Secondary | ICD-10-CM | POA: Diagnosis not present

## 2019-06-06 DIAGNOSIS — M47816 Spondylosis without myelopathy or radiculopathy, lumbar region: Secondary | ICD-10-CM | POA: Diagnosis not present

## 2019-06-06 DIAGNOSIS — G894 Chronic pain syndrome: Secondary | ICD-10-CM | POA: Diagnosis not present

## 2019-06-06 DIAGNOSIS — Z1389 Encounter for screening for other disorder: Secondary | ICD-10-CM | POA: Diagnosis not present

## 2019-06-06 DIAGNOSIS — M5136 Other intervertebral disc degeneration, lumbar region: Secondary | ICD-10-CM | POA: Diagnosis not present

## 2019-06-06 DIAGNOSIS — M48061 Spinal stenosis, lumbar region without neurogenic claudication: Secondary | ICD-10-CM | POA: Diagnosis not present

## 2019-06-20 DIAGNOSIS — Z6836 Body mass index (BMI) 36.0-36.9, adult: Secondary | ICD-10-CM | POA: Diagnosis not present

## 2019-06-23 DIAGNOSIS — E0590 Thyrotoxicosis, unspecified without thyrotoxic crisis or storm: Secondary | ICD-10-CM | POA: Diagnosis not present

## 2019-07-07 DIAGNOSIS — G894 Chronic pain syndrome: Secondary | ICD-10-CM | POA: Diagnosis not present

## 2019-07-07 DIAGNOSIS — M5136 Other intervertebral disc degeneration, lumbar region: Secondary | ICD-10-CM | POA: Diagnosis not present

## 2019-07-07 DIAGNOSIS — M47816 Spondylosis without myelopathy or radiculopathy, lumbar region: Secondary | ICD-10-CM | POA: Diagnosis not present

## 2019-07-07 DIAGNOSIS — E669 Obesity, unspecified: Secondary | ICD-10-CM | POA: Diagnosis not present

## 2019-07-07 DIAGNOSIS — E0590 Thyrotoxicosis, unspecified without thyrotoxic crisis or storm: Secondary | ICD-10-CM | POA: Diagnosis not present

## 2019-07-07 DIAGNOSIS — Z6833 Body mass index (BMI) 33.0-33.9, adult: Secondary | ICD-10-CM | POA: Diagnosis not present

## 2019-07-07 DIAGNOSIS — Z1389 Encounter for screening for other disorder: Secondary | ICD-10-CM | POA: Diagnosis not present

## 2019-07-07 DIAGNOSIS — I83813 Varicose veins of bilateral lower extremities with pain: Secondary | ICD-10-CM | POA: Diagnosis not present

## 2019-07-07 DIAGNOSIS — G629 Polyneuropathy, unspecified: Secondary | ICD-10-CM | POA: Diagnosis not present

## 2019-07-07 DIAGNOSIS — M48061 Spinal stenosis, lumbar region without neurogenic claudication: Secondary | ICD-10-CM | POA: Diagnosis not present

## 2019-07-22 DIAGNOSIS — Z1231 Encounter for screening mammogram for malignant neoplasm of breast: Secondary | ICD-10-CM | POA: Diagnosis not present

## 2019-07-26 DIAGNOSIS — E042 Nontoxic multinodular goiter: Secondary | ICD-10-CM | POA: Diagnosis not present

## 2019-07-26 DIAGNOSIS — E0590 Thyrotoxicosis, unspecified without thyrotoxic crisis or storm: Secondary | ICD-10-CM | POA: Diagnosis not present

## 2019-08-04 DIAGNOSIS — I83813 Varicose veins of bilateral lower extremities with pain: Secondary | ICD-10-CM | POA: Diagnosis not present

## 2019-08-04 DIAGNOSIS — Z6832 Body mass index (BMI) 32.0-32.9, adult: Secondary | ICD-10-CM | POA: Diagnosis not present

## 2019-08-04 DIAGNOSIS — Z1389 Encounter for screening for other disorder: Secondary | ICD-10-CM | POA: Diagnosis not present

## 2019-08-04 DIAGNOSIS — M48061 Spinal stenosis, lumbar region without neurogenic claudication: Secondary | ICD-10-CM | POA: Diagnosis not present

## 2019-08-04 DIAGNOSIS — E041 Nontoxic single thyroid nodule: Secondary | ICD-10-CM | POA: Diagnosis not present

## 2019-08-04 DIAGNOSIS — M47816 Spondylosis without myelopathy or radiculopathy, lumbar region: Secondary | ICD-10-CM | POA: Diagnosis not present

## 2019-08-04 DIAGNOSIS — E0590 Thyrotoxicosis, unspecified without thyrotoxic crisis or storm: Secondary | ICD-10-CM | POA: Diagnosis not present

## 2019-08-04 DIAGNOSIS — G629 Polyneuropathy, unspecified: Secondary | ICD-10-CM | POA: Diagnosis not present

## 2019-08-04 DIAGNOSIS — G894 Chronic pain syndrome: Secondary | ICD-10-CM | POA: Diagnosis not present

## 2019-08-04 DIAGNOSIS — M5136 Other intervertebral disc degeneration, lumbar region: Secondary | ICD-10-CM | POA: Diagnosis not present

## 2019-08-08 DIAGNOSIS — Z6833 Body mass index (BMI) 33.0-33.9, adult: Secondary | ICD-10-CM | POA: Diagnosis not present

## 2019-08-10 DIAGNOSIS — N6314 Unspecified lump in the right breast, lower inner quadrant: Secondary | ICD-10-CM | POA: Diagnosis not present

## 2019-08-10 DIAGNOSIS — R928 Other abnormal and inconclusive findings on diagnostic imaging of breast: Secondary | ICD-10-CM | POA: Diagnosis not present

## 2019-08-10 DIAGNOSIS — N6001 Solitary cyst of right breast: Secondary | ICD-10-CM | POA: Diagnosis not present

## 2019-08-10 DIAGNOSIS — N6312 Unspecified lump in the right breast, upper inner quadrant: Secondary | ICD-10-CM | POA: Diagnosis not present

## 2019-08-22 DIAGNOSIS — Z6833 Body mass index (BMI) 33.0-33.9, adult: Secondary | ICD-10-CM | POA: Diagnosis not present

## 2019-08-29 ENCOUNTER — Encounter: Payer: Medicare HMO | Admitting: Surgery

## 2019-08-29 ENCOUNTER — Encounter (HOSPITAL_COMMUNITY): Payer: Medicare HMO

## 2019-08-29 DIAGNOSIS — E041 Nontoxic single thyroid nodule: Secondary | ICD-10-CM | POA: Diagnosis not present

## 2019-08-29 DIAGNOSIS — E042 Nontoxic multinodular goiter: Secondary | ICD-10-CM | POA: Diagnosis not present

## 2019-09-01 DIAGNOSIS — Z1389 Encounter for screening for other disorder: Secondary | ICD-10-CM | POA: Diagnosis not present

## 2019-09-01 DIAGNOSIS — G629 Polyneuropathy, unspecified: Secondary | ICD-10-CM | POA: Diagnosis not present

## 2019-09-01 DIAGNOSIS — M47816 Spondylosis without myelopathy or radiculopathy, lumbar region: Secondary | ICD-10-CM | POA: Diagnosis not present

## 2019-09-01 DIAGNOSIS — Z79891 Long term (current) use of opiate analgesic: Secondary | ICD-10-CM | POA: Diagnosis not present

## 2019-09-01 DIAGNOSIS — M48061 Spinal stenosis, lumbar region without neurogenic claudication: Secondary | ICD-10-CM | POA: Diagnosis not present

## 2019-09-01 DIAGNOSIS — M5136 Other intervertebral disc degeneration, lumbar region: Secondary | ICD-10-CM | POA: Diagnosis not present

## 2019-09-01 DIAGNOSIS — I83813 Varicose veins of bilateral lower extremities with pain: Secondary | ICD-10-CM | POA: Diagnosis not present

## 2019-09-01 DIAGNOSIS — G894 Chronic pain syndrome: Secondary | ICD-10-CM | POA: Diagnosis not present

## 2019-09-05 DIAGNOSIS — Z23 Encounter for immunization: Secondary | ICD-10-CM | POA: Diagnosis not present

## 2019-09-05 DIAGNOSIS — Z6833 Body mass index (BMI) 33.0-33.9, adult: Secondary | ICD-10-CM | POA: Diagnosis not present

## 2019-09-06 DIAGNOSIS — E041 Nontoxic single thyroid nodule: Secondary | ICD-10-CM | POA: Diagnosis not present

## 2019-09-08 DIAGNOSIS — M25561 Pain in right knee: Secondary | ICD-10-CM | POA: Diagnosis not present

## 2019-09-08 DIAGNOSIS — E0590 Thyrotoxicosis, unspecified without thyrotoxic crisis or storm: Secondary | ICD-10-CM | POA: Diagnosis not present

## 2019-09-08 DIAGNOSIS — E041 Nontoxic single thyroid nodule: Secondary | ICD-10-CM | POA: Diagnosis not present

## 2019-09-08 DIAGNOSIS — N6310 Unspecified lump in the right breast, unspecified quadrant: Secondary | ICD-10-CM | POA: Diagnosis not present

## 2019-09-15 ENCOUNTER — Other Ambulatory Visit: Payer: Self-pay

## 2019-09-15 DIAGNOSIS — I83893 Varicose veins of bilateral lower extremities with other complications: Secondary | ICD-10-CM

## 2019-09-19 ENCOUNTER — Encounter (HOSPITAL_COMMUNITY): Payer: Medicare HMO

## 2019-09-19 ENCOUNTER — Encounter: Payer: Medicare HMO | Admitting: Surgery

## 2019-09-21 DIAGNOSIS — E0590 Thyrotoxicosis, unspecified without thyrotoxic crisis or storm: Secondary | ICD-10-CM | POA: Diagnosis not present

## 2019-09-21 DIAGNOSIS — E041 Nontoxic single thyroid nodule: Secondary | ICD-10-CM | POA: Diagnosis not present

## 2019-09-21 DIAGNOSIS — Z6831 Body mass index (BMI) 31.0-31.9, adult: Secondary | ICD-10-CM | POA: Diagnosis not present

## 2019-09-27 ENCOUNTER — Encounter (HOSPITAL_COMMUNITY): Payer: Self-pay

## 2019-10-03 DIAGNOSIS — M48061 Spinal stenosis, lumbar region without neurogenic claudication: Secondary | ICD-10-CM | POA: Diagnosis not present

## 2019-10-03 DIAGNOSIS — Z79891 Long term (current) use of opiate analgesic: Secondary | ICD-10-CM | POA: Diagnosis not present

## 2019-10-03 DIAGNOSIS — G629 Polyneuropathy, unspecified: Secondary | ICD-10-CM | POA: Diagnosis not present

## 2019-10-03 DIAGNOSIS — M47816 Spondylosis without myelopathy or radiculopathy, lumbar region: Secondary | ICD-10-CM | POA: Diagnosis not present

## 2019-10-03 DIAGNOSIS — M5136 Other intervertebral disc degeneration, lumbar region: Secondary | ICD-10-CM | POA: Diagnosis not present

## 2019-10-03 DIAGNOSIS — I83813 Varicose veins of bilateral lower extremities with pain: Secondary | ICD-10-CM | POA: Diagnosis not present

## 2019-10-03 DIAGNOSIS — G894 Chronic pain syndrome: Secondary | ICD-10-CM | POA: Diagnosis not present

## 2019-10-03 DIAGNOSIS — Z6831 Body mass index (BMI) 31.0-31.9, adult: Secondary | ICD-10-CM | POA: Diagnosis not present

## 2019-10-03 DIAGNOSIS — Z1389 Encounter for screening for other disorder: Secondary | ICD-10-CM | POA: Diagnosis not present

## 2019-10-17 DIAGNOSIS — Z6831 Body mass index (BMI) 31.0-31.9, adult: Secondary | ICD-10-CM | POA: Diagnosis not present

## 2019-10-27 DIAGNOSIS — I83813 Varicose veins of bilateral lower extremities with pain: Secondary | ICD-10-CM | POA: Diagnosis not present

## 2019-10-27 DIAGNOSIS — E669 Obesity, unspecified: Secondary | ICD-10-CM | POA: Diagnosis not present

## 2019-10-27 DIAGNOSIS — M48061 Spinal stenosis, lumbar region without neurogenic claudication: Secondary | ICD-10-CM | POA: Diagnosis not present

## 2019-10-27 DIAGNOSIS — M47816 Spondylosis without myelopathy or radiculopathy, lumbar region: Secondary | ICD-10-CM | POA: Diagnosis not present

## 2019-10-27 DIAGNOSIS — G629 Polyneuropathy, unspecified: Secondary | ICD-10-CM | POA: Diagnosis not present

## 2019-10-27 DIAGNOSIS — M5136 Other intervertebral disc degeneration, lumbar region: Secondary | ICD-10-CM | POA: Diagnosis not present

## 2019-10-27 DIAGNOSIS — Z1389 Encounter for screening for other disorder: Secondary | ICD-10-CM | POA: Diagnosis not present

## 2019-10-27 DIAGNOSIS — G894 Chronic pain syndrome: Secondary | ICD-10-CM | POA: Diagnosis not present

## 2019-10-27 DIAGNOSIS — Z79891 Long term (current) use of opiate analgesic: Secondary | ICD-10-CM | POA: Diagnosis not present

## 2019-10-31 DIAGNOSIS — Z6831 Body mass index (BMI) 31.0-31.9, adult: Secondary | ICD-10-CM | POA: Diagnosis not present

## 2019-11-07 DIAGNOSIS — I10 Essential (primary) hypertension: Secondary | ICD-10-CM | POA: Diagnosis not present

## 2019-11-07 DIAGNOSIS — E0590 Thyrotoxicosis, unspecified without thyrotoxic crisis or storm: Secondary | ICD-10-CM | POA: Diagnosis not present

## 2019-11-07 DIAGNOSIS — R7301 Impaired fasting glucose: Secondary | ICD-10-CM | POA: Diagnosis not present

## 2019-11-07 DIAGNOSIS — E781 Pure hyperglyceridemia: Secondary | ICD-10-CM | POA: Diagnosis not present

## 2019-11-10 DIAGNOSIS — R7303 Prediabetes: Secondary | ICD-10-CM | POA: Diagnosis not present

## 2019-11-10 DIAGNOSIS — I10 Essential (primary) hypertension: Secondary | ICD-10-CM | POA: Diagnosis not present

## 2019-11-10 DIAGNOSIS — E781 Pure hyperglyceridemia: Secondary | ICD-10-CM | POA: Diagnosis not present

## 2019-11-10 DIAGNOSIS — E0590 Thyrotoxicosis, unspecified without thyrotoxic crisis or storm: Secondary | ICD-10-CM | POA: Diagnosis not present

## 2019-11-14 DIAGNOSIS — Z6830 Body mass index (BMI) 30.0-30.9, adult: Secondary | ICD-10-CM | POA: Diagnosis not present

## 2019-11-28 DIAGNOSIS — Z6830 Body mass index (BMI) 30.0-30.9, adult: Secondary | ICD-10-CM | POA: Diagnosis not present

## 2019-11-28 DIAGNOSIS — I10 Essential (primary) hypertension: Secondary | ICD-10-CM | POA: Diagnosis not present

## 2019-11-28 DIAGNOSIS — R7303 Prediabetes: Secondary | ICD-10-CM | POA: Diagnosis not present

## 2019-11-28 DIAGNOSIS — E785 Hyperlipidemia, unspecified: Secondary | ICD-10-CM | POA: Diagnosis not present

## 2019-12-07 DIAGNOSIS — Z03818 Encounter for observation for suspected exposure to other biological agents ruled out: Secondary | ICD-10-CM | POA: Diagnosis not present

## 2019-12-12 DIAGNOSIS — Z6830 Body mass index (BMI) 30.0-30.9, adult: Secondary | ICD-10-CM | POA: Diagnosis not present

## 2019-12-15 DIAGNOSIS — Z03818 Encounter for observation for suspected exposure to other biological agents ruled out: Secondary | ICD-10-CM | POA: Diagnosis not present

## 2019-12-19 DIAGNOSIS — Z03818 Encounter for observation for suspected exposure to other biological agents ruled out: Secondary | ICD-10-CM | POA: Diagnosis not present

## 2019-12-26 DIAGNOSIS — Z03818 Encounter for observation for suspected exposure to other biological agents ruled out: Secondary | ICD-10-CM | POA: Diagnosis not present

## 2020-01-02 DIAGNOSIS — G629 Polyneuropathy, unspecified: Secondary | ICD-10-CM | POA: Diagnosis not present

## 2020-01-02 DIAGNOSIS — Z79891 Long term (current) use of opiate analgesic: Secondary | ICD-10-CM | POA: Diagnosis not present

## 2020-01-02 DIAGNOSIS — Z981 Arthrodesis status: Secondary | ICD-10-CM | POA: Diagnosis not present

## 2020-01-02 DIAGNOSIS — M47816 Spondylosis without myelopathy or radiculopathy, lumbar region: Secondary | ICD-10-CM | POA: Diagnosis not present

## 2020-01-02 DIAGNOSIS — E669 Obesity, unspecified: Secondary | ICD-10-CM | POA: Diagnosis not present

## 2020-01-02 DIAGNOSIS — M5136 Other intervertebral disc degeneration, lumbar region: Secondary | ICD-10-CM | POA: Diagnosis not present

## 2020-01-02 DIAGNOSIS — G894 Chronic pain syndrome: Secondary | ICD-10-CM | POA: Diagnosis not present

## 2020-01-02 DIAGNOSIS — I83813 Varicose veins of bilateral lower extremities with pain: Secondary | ICD-10-CM | POA: Diagnosis not present

## 2020-01-02 DIAGNOSIS — Z03818 Encounter for observation for suspected exposure to other biological agents ruled out: Secondary | ICD-10-CM | POA: Diagnosis not present

## 2020-01-16 DIAGNOSIS — Z03818 Encounter for observation for suspected exposure to other biological agents ruled out: Secondary | ICD-10-CM | POA: Diagnosis not present

## 2020-01-19 DIAGNOSIS — D497 Neoplasm of unspecified behavior of endocrine glands and other parts of nervous system: Secondary | ICD-10-CM | POA: Diagnosis not present

## 2020-01-19 DIAGNOSIS — J342 Deviated nasal septum: Secondary | ICD-10-CM | POA: Diagnosis not present

## 2020-01-19 DIAGNOSIS — E042 Nontoxic multinodular goiter: Secondary | ICD-10-CM | POA: Diagnosis not present

## 2020-01-25 DIAGNOSIS — Z01818 Encounter for other preprocedural examination: Secondary | ICD-10-CM | POA: Diagnosis not present

## 2020-01-25 DIAGNOSIS — I10 Essential (primary) hypertension: Secondary | ICD-10-CM | POA: Diagnosis not present

## 2020-01-25 DIAGNOSIS — E0590 Thyrotoxicosis, unspecified without thyrotoxic crisis or storm: Secondary | ICD-10-CM | POA: Diagnosis not present

## 2020-01-25 DIAGNOSIS — R7303 Prediabetes: Secondary | ICD-10-CM | POA: Diagnosis not present

## 2020-01-30 DIAGNOSIS — Z79891 Long term (current) use of opiate analgesic: Secondary | ICD-10-CM | POA: Diagnosis not present

## 2020-01-30 DIAGNOSIS — E669 Obesity, unspecified: Secondary | ICD-10-CM | POA: Diagnosis not present

## 2020-01-30 DIAGNOSIS — Z981 Arthrodesis status: Secondary | ICD-10-CM | POA: Diagnosis not present

## 2020-01-30 DIAGNOSIS — G629 Polyneuropathy, unspecified: Secondary | ICD-10-CM | POA: Diagnosis not present

## 2020-01-30 DIAGNOSIS — M47816 Spondylosis without myelopathy or radiculopathy, lumbar region: Secondary | ICD-10-CM | POA: Diagnosis not present

## 2020-01-30 DIAGNOSIS — M5136 Other intervertebral disc degeneration, lumbar region: Secondary | ICD-10-CM | POA: Diagnosis not present

## 2020-01-30 DIAGNOSIS — Z03818 Encounter for observation for suspected exposure to other biological agents ruled out: Secondary | ICD-10-CM | POA: Diagnosis not present

## 2020-01-30 DIAGNOSIS — I83813 Varicose veins of bilateral lower extremities with pain: Secondary | ICD-10-CM | POA: Diagnosis not present

## 2020-02-06 DIAGNOSIS — Z6832 Body mass index (BMI) 32.0-32.9, adult: Secondary | ICD-10-CM | POA: Diagnosis not present

## 2020-02-09 DIAGNOSIS — Z03818 Encounter for observation for suspected exposure to other biological agents ruled out: Secondary | ICD-10-CM | POA: Diagnosis not present

## 2020-02-11 ENCOUNTER — Other Ambulatory Visit: Payer: Self-pay | Admitting: Gastroenterology

## 2020-02-15 DIAGNOSIS — Z03818 Encounter for observation for suspected exposure to other biological agents ruled out: Secondary | ICD-10-CM | POA: Diagnosis not present

## 2020-02-22 DIAGNOSIS — Z03818 Encounter for observation for suspected exposure to other biological agents ruled out: Secondary | ICD-10-CM | POA: Diagnosis not present

## 2020-02-27 DIAGNOSIS — M47816 Spondylosis without myelopathy or radiculopathy, lumbar region: Secondary | ICD-10-CM | POA: Diagnosis not present

## 2020-02-27 DIAGNOSIS — E669 Obesity, unspecified: Secondary | ICD-10-CM | POA: Diagnosis not present

## 2020-02-27 DIAGNOSIS — G629 Polyneuropathy, unspecified: Secondary | ICD-10-CM | POA: Diagnosis not present

## 2020-02-27 DIAGNOSIS — Z79891 Long term (current) use of opiate analgesic: Secondary | ICD-10-CM | POA: Diagnosis not present

## 2020-02-27 DIAGNOSIS — I83813 Varicose veins of bilateral lower extremities with pain: Secondary | ICD-10-CM | POA: Diagnosis not present

## 2020-02-27 DIAGNOSIS — G894 Chronic pain syndrome: Secondary | ICD-10-CM | POA: Diagnosis not present

## 2020-02-27 DIAGNOSIS — Z981 Arthrodesis status: Secondary | ICD-10-CM | POA: Diagnosis not present

## 2020-02-27 DIAGNOSIS — M5136 Other intervertebral disc degeneration, lumbar region: Secondary | ICD-10-CM | POA: Diagnosis not present

## 2020-02-29 DIAGNOSIS — N6001 Solitary cyst of right breast: Secondary | ICD-10-CM | POA: Diagnosis not present

## 2020-02-29 DIAGNOSIS — R928 Other abnormal and inconclusive findings on diagnostic imaging of breast: Secondary | ICD-10-CM | POA: Diagnosis not present

## 2020-02-29 DIAGNOSIS — Z79899 Other long term (current) drug therapy: Secondary | ICD-10-CM | POA: Diagnosis not present

## 2020-03-01 DIAGNOSIS — E042 Nontoxic multinodular goiter: Secondary | ICD-10-CM | POA: Diagnosis not present

## 2020-03-01 DIAGNOSIS — E0590 Thyrotoxicosis, unspecified without thyrotoxic crisis or storm: Secondary | ICD-10-CM | POA: Diagnosis not present

## 2020-03-01 DIAGNOSIS — Z03818 Encounter for observation for suspected exposure to other biological agents ruled out: Secondary | ICD-10-CM | POA: Diagnosis not present

## 2020-03-02 ENCOUNTER — Other Ambulatory Visit: Payer: Self-pay | Admitting: Surgery

## 2020-03-02 DIAGNOSIS — E041 Nontoxic single thyroid nodule: Secondary | ICD-10-CM

## 2020-03-05 DIAGNOSIS — I10 Essential (primary) hypertension: Secondary | ICD-10-CM | POA: Diagnosis not present

## 2020-03-05 DIAGNOSIS — E785 Hyperlipidemia, unspecified: Secondary | ICD-10-CM | POA: Diagnosis not present

## 2020-03-05 DIAGNOSIS — E781 Pure hyperglyceridemia: Secondary | ICD-10-CM | POA: Diagnosis not present

## 2020-03-05 DIAGNOSIS — E669 Obesity, unspecified: Secondary | ICD-10-CM | POA: Diagnosis not present

## 2020-03-05 DIAGNOSIS — Z6832 Body mass index (BMI) 32.0-32.9, adult: Secondary | ICD-10-CM | POA: Diagnosis not present

## 2020-03-08 DIAGNOSIS — Z03818 Encounter for observation for suspected exposure to other biological agents ruled out: Secondary | ICD-10-CM | POA: Diagnosis not present

## 2020-03-14 DIAGNOSIS — Z03818 Encounter for observation for suspected exposure to other biological agents ruled out: Secondary | ICD-10-CM | POA: Diagnosis not present

## 2020-03-21 ENCOUNTER — Other Ambulatory Visit (HOSPITAL_COMMUNITY)
Admission: RE | Admit: 2020-03-21 | Discharge: 2020-03-21 | Disposition: A | Discharge status: Home / Self Care | Payer: Medicare HMO | Source: Ambulatory Visit | Attending: Radiology | Admitting: Radiology

## 2020-03-21 ENCOUNTER — Ambulatory Visit
Admission: RE | Admit: 2020-03-21 | Discharge: 2020-03-21 | Disposition: A | Discharge status: Home / Self Care | Payer: Medicare HMO | Source: Ambulatory Visit | Attending: Surgery | Admitting: Surgery

## 2020-03-21 DIAGNOSIS — E042 Nontoxic multinodular goiter: Secondary | ICD-10-CM | POA: Diagnosis present

## 2020-03-21 DIAGNOSIS — E041 Nontoxic single thyroid nodule: Secondary | ICD-10-CM

## 2020-03-21 DIAGNOSIS — Z03818 Encounter for observation for suspected exposure to other biological agents ruled out: Secondary | ICD-10-CM | POA: Diagnosis not present

## 2020-03-21 DIAGNOSIS — E079 Disorder of thyroid, unspecified: Secondary | ICD-10-CM | POA: Diagnosis not present

## 2020-03-23 LAB — CYTOLOGY - NON PAP

## 2020-03-26 ENCOUNTER — Other Ambulatory Visit: Payer: Self-pay

## 2020-03-26 DIAGNOSIS — M5136 Other intervertebral disc degeneration, lumbar region: Secondary | ICD-10-CM | POA: Diagnosis not present

## 2020-03-26 DIAGNOSIS — E669 Obesity, unspecified: Secondary | ICD-10-CM | POA: Diagnosis not present

## 2020-03-26 DIAGNOSIS — M47816 Spondylosis without myelopathy or radiculopathy, lumbar region: Secondary | ICD-10-CM | POA: Diagnosis not present

## 2020-03-26 DIAGNOSIS — Z79891 Long term (current) use of opiate analgesic: Secondary | ICD-10-CM | POA: Diagnosis not present

## 2020-03-26 DIAGNOSIS — I83813 Varicose veins of bilateral lower extremities with pain: Secondary | ICD-10-CM | POA: Diagnosis not present

## 2020-03-26 DIAGNOSIS — Z981 Arthrodesis status: Secondary | ICD-10-CM | POA: Diagnosis not present

## 2020-03-26 DIAGNOSIS — G894 Chronic pain syndrome: Secondary | ICD-10-CM | POA: Diagnosis not present

## 2020-03-26 DIAGNOSIS — G629 Polyneuropathy, unspecified: Secondary | ICD-10-CM | POA: Diagnosis not present

## 2020-03-28 ENCOUNTER — Other Ambulatory Visit: Payer: Self-pay

## 2020-03-28 ENCOUNTER — Encounter: Payer: Self-pay | Admitting: Internal Medicine

## 2020-03-28 ENCOUNTER — Ambulatory Visit (INDEPENDENT_AMBULATORY_CARE_PROVIDER_SITE_OTHER): Payer: Medicare HMO | Admitting: Internal Medicine

## 2020-03-28 VITALS — BP 130/70 | HR 64 | Ht 65.0 in | Wt 183.0 lb

## 2020-03-28 DIAGNOSIS — E042 Nontoxic multinodular goiter: Secondary | ICD-10-CM | POA: Diagnosis not present

## 2020-03-28 DIAGNOSIS — E0590 Thyrotoxicosis, unspecified without thyrotoxic crisis or storm: Secondary | ICD-10-CM

## 2020-03-28 NOTE — Patient Instructions (Addendum)
Please stop Biotin and come back for labs in 7-10 days.  Please return in 3-4 months.   Thyroid Nodule  A thyroid nodule is an isolated growth of thyroid cells that forms a lump in your thyroid gland. The thyroid gland is a butterfly-shaped gland. It is found in the lower front of your neck. This gland sends chemical messengers (hormones) through your blood to all parts of your body. These hormones are important in regulating your body temperature and helping your body to use energy. Thyroid nodules are common. Most are not cancerous (benign). You may have one nodule or several nodules. Different types of thyroid nodules include nodules that:  Grow and fill with fluid (thyroid cysts).  Produce too much thyroid hormone (hot nodules or hyperthyroid).  Produce no thyroid hormone (cold nodules or hypothyroid).  Form from cancer cells (thyroid cancers). What are the causes? In most cases, the cause of this condition is not known. What increases the risk? The following factors may make you more likely to develop this condition.  Age. Thyroid nodules become more common in people who are older than 68 years of age.  Gender. ? Benign thyroid nodules are more common in women. ? Cancerous (malignant) thyroid nodules are more common in men.  A family history that includes: ? Thyroid nodules. ? Pheochromocytoma. ? Thyroid carcinoma. ? Hyperparathyroidism.  Certain kinds of thyroid diseases, such as Hashimoto's thyroiditis.  Lack of iodine in your diet.  A history of head and neck radiation, such as from previous cancer treatment. What are the signs or symptoms? In many cases, there are no symptoms. If you have symptoms, they may include:  A lump in your lower neck.  Feeling a lump or tickle in your throat.  Pain in your neck, jaw, or ear.  Having trouble swallowing. Hot nodules may cause symptoms that include:  Weight loss.  Warm, flushed skin.  Feeling hot.  Feeling  nervous.  A racing heartbeat. Cold nodules may cause symptoms that include:  Weight gain.  Dry skin.  Brittle hair. This may also occur with hair loss.  Feeling cold.  Fatigue. Thyroid cancer nodules may cause symptoms that include:  Hard nodules that feel stuck to the thyroid gland.  Hoarseness.  Lumps in the glands near your thyroid (lymph nodes). How is this diagnosed? A thyroid nodule may be felt by your health care provider during a physical exam. This condition may also be diagnosed based on your symptoms. You may also have tests, including:  An ultrasound. This may be done to confirm the diagnosis.  A biopsy. This involves taking a sample from the nodule and looking at it under a microscope.  Blood tests to make sure that your thyroid is working properly.  A thyroid scan. This test uses a radioactive tracer injected into a vein to create an image of the thyroid gland on a computer screen.  Imaging tests such as MRI or CT scan. These may be done if: ? Your nodule is large. ? Your nodule is blocking your airway. ? Cancer is suspected. How is this treated? Treatment depends on the cause and size of your nodule or nodules. If the nodule is benign, treatment may not be necessary. Your health care provider may monitor the nodule to see if it goes away without treatment. If the nodule continues to grow, is cancerous, or does not go away, treatment may be needed. Treatment may include:  Having a cystic nodule drained with a needle.  Ablation therapy.  In this treatment, alcohol is injected into the area of the nodule to destroy the cells. Ablation with heat (thermal ablation) may also be used.  Radioactive iodine. In this treatment, radioactive iodine is given as a pill or liquid that you drink. This substance causes the thyroid nodule to shrink.  Surgery to remove the nodule. Part or all of your thyroid gland may need to be removed as well.  Medicines. Follow these  instructions at home:  Pay attention to any changes in your nodule.  Take over-the-counter and prescription medicines only as told by your health care provider.  Keep all follow-up visits as told by your health care provider. This is important. Contact a health care provider if:  Your voice changes.  You have trouble swallowing.  You have pain in your neck, ear, or jaw that is getting worse.  Your nodule gets bigger.  Your nodule starts to make it harder for you to breathe.  Your muscles look like they are shrinking (muscle wasting). Get help right away if:  You have chest pain.  There is a loss of consciousness.  You have a sudden fever.  You feel confused.  You are seeing or hearing things that other people do not see or hear (having hallucinations).  You feel very weak.  You have mood swings.  You feel very restless.  You feel suddenly nauseous or throw up.  You suddenly have diarrhea. Summary  A thyroid nodule is an isolated growth of thyroid cells that forms a lump in your thyroid gland.  Thyroid nodules are common. Most are not cancerous (benign). You may have one nodule or several nodules.  Treatment depends on the cause and size of your nodule or nodules. If the nodule is benign, treatment may not be necessary.  Your health care provider may monitor the nodule to see if it goes away without treatment. If the nodule continues to grow, is cancerous, or does not go away, treatment may be needed. This information is not intended to replace advice given to you by your health care provider. Make sure you discuss any questions you have with your health care provider. Document Revised: 06/25/2018 Document Reviewed: 06/28/2018 [employer] Patient Education  [employer].

## 2020-03-28 NOTE — Progress Notes (Addendum)
Patient ID: MADISIN SHERFEY, female   DOB: Oct 24, 1951, 69 y.o.   MRN: [id number]   This visit occurred during the SARS-CoV-2 public health emergency.  Safety protocols were in place, including screening questions prior to the visit, additional usage of staff PPE, and extensive cleaning of exam room while observing appropriate contact time as indicated for disinfecting solutions.   HPI  CALANDRIA YAKLIN is a 69 y.o.-year-old female, referred by Dr. Harlow Asa, for evaluation and management of thyrotoxicosis and thyroid nodules.  Patient was diagnosed with thyroid nodules in 2016 after PCP palpated her thyroid. She recently saw an endocrinologist >> had bx'es in 08/2019 (see below - one inconclusive) >> She was referred to ENT >> suggested total thyroidectomy >> saw Dr. Harlow Asa for a second opinion >> he did not think patient needed surgery at that time >> referred her to me.  I reviewed pt's thyroid tests: 01/25/2020: TSH 0.033 (0.45-4.5) Lab Results  Component Value Date   TSH 0.07 (L) 02/01/2018   Antithyroid antibodies: No results found for: TSI  Thyroid ultrasound (07/26/2019): 3 dominant nodules (only report, no images available:  Right superior 2.4 x 1.2 x 1.2 cm nodule, solid/almost completely solid (2), with punctate echogenic foci (3), and macrocalcifications (1).  ACR TI-RADS total points: 7.  Biopsy recommended.  Right inferior 2.5 x 2.2 x 1.6 cm nodule, solid/almost completely solid (2), hypoechoic (2), ACR TI-RADS total points: 4.  Biopsy recommended.  Left mid 2.7 x 1.4 x 1.3 cm nodule, solid/almost completely solid (2), hypoechoic (2), macrocalcifications (1), ACR TI-RADS total points: 5.  Biopsy recommended.  0.6 x 0.6 x 0.5 cm anechoic cyst within the superior pole of the left lobe containing internal echogenic foci remain -artifact compatible with benign colloid.  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 No FH of thyroid cancer. No h/o radiation tx to head or neck.  No seaweed or kelp, no recent contrast studies. No steroid use. No herbal supplements.  On 5,000 mcg biotin daily!  Pt. also has a history of nephrolithiasis  - s/p sx, HL, HTN, GERD.  She had cervical spine surgery.  ROS: Constitutional: + see HPI Eyes: + Blurry vision, no xerophthalmia ENT: no sore throat, + see HPI Cardiovascular: no CP/SOB/palpitations/leg swelling Respiratory: no cough/SOB Gastrointestinal: no N/V/D/C/+ heartburn Musculoskeletal: + Muscle aches, no joint aches Skin: no rashes Neurological: no tremors/numbness/tingling/dizziness, + headache Psychiatric: no depression/anxiety + Low libido  Past Medical History:  Diagnosis Date  . Anal lesion   . Arthritis   . Back pain   . Bulging lumbar disc   . Cholelithiasis   . Cyst of spleen   . Gastric polyps   . GERD (gastroesophageal reflux disease)   . History of colon polyps 06/29/2018   2 9-12 mm polps transverse and ascending colon, 14 mm sigmoid colon  .  History of kidney stones   . History of rectal bleeding   . Hypertension   . Hypertriglyceridemia   . Hypothyroidism   . IBS (irritable bowel syndrome)   . Incontinence of feces with fecal urgency   . Internal hemorrhoids    grade 2-3  . Migraine   . Neuromuscular disorder ([place])   . Peripheral  neuropathy    due to antibiotic use  . Pinched nerve    L4-L5  . Pneumonia 01/2017  . Spinal stenosis   . Thyroid goiter   . Wears glasses    Past Surgical History:  Procedure Laterality Date  . APPENDECTOMY  2018  . CERVICAL FUSION  2016  . COLONOSCOPY W/ POLYPECTOMY  06/29/2018  . ESOPHAGOGASTRODUODENOSCOPY  02/10/2013   in [place], [place], Dr. Gerri Spore  . HEMORRHOID SURGERY N/A 08/03/2018   Procedure: HEMORRHOIDAL PEXY;  Surgeon: Leighton Ruff, MD;  Location: [facility];  Service: General;  Laterality: N/A;  . KIDNEY STONE SURGERY     x 2  . LITHOTRIPSY  2008   2-3 litho  . NEPHROSTOMY    . PARTIAL HYSTERECTOMY  2000  . RECTAL EXAM UNDER ANESTHESIA N/A 08/03/2018   Procedure: ANAL  EXAM UNDER ANESTHESIA;  Surgeon: Leighton Ruff, MD;  Location: [facility];  Service: General;  Laterality: N/A;   Social History   Socioeconomic History  . Marital status: Married    Spouse name: Not on file  . Number of children: 3  . Years of education: Not on file  . Highest education level: Not on file  Occupational History  . Occupation: CNA    Comment: [employer]  Tobacco Use  . Smoking status: Former Smoker    Packs/day: 1.00    Years: 15.00    Pack years: 15.00    Types: Cigarettes    Quit date: 2005    Years since quitting: 16.3  . Smokeless tobacco: Never Used  Substance and Sexual Activity  . Alcohol use: Yes    Comment: rare  . Drug use: No  . Sexual activity: Yes    Birth control/protection: Surgical  Other Topics Concern  . Not on file  Social History Narrative  . Not on file   Social Determinants of Health   Financial Resource Strain:   . Difficulty of Paying Living Expenses:   Food Insecurity:   . Worried About Charity fundraiser in the Last Year:   . Arboriculturist in the Last Year:   Transportation Needs:   . Film/video editor (Medical):   Marland Kitchen Lack of Transportation (Non-Medical):   Physical  Activity:   . Days of Exercise per Week:   . Minutes of Exercise per Session:   Stress:   . Feeling of Stress :   Social Connections:   . Frequency of Communication with Friends and Family:   . Frequency of Social Gatherings with Friends and Family:   . Attends Religious Services:   . Active Member of Clubs or Organizations:   . Attends Archivist Meetings:   Marland Kitchen Marital Status:   Intimate Partner Violence:   . Fear of Current or Ex-Partner:   . Emotionally Abused:   Marland Kitchen Physically Abused:   . Sexually Abused:    Current Outpatient Medications on File Prior to Visit  Medication Sig Dispense Refill  . amLODipine-benazepril (LOTREL) 10-40 MG capsule Take 1 capsule by mouth daily.  1  . aspirin 81 MG chewable tablet Chew by  mouth daily.    . Biotin 10 MG CAPS Take by mouth.    . Cholecalciferol (VITAMIN D PO) Take by mouth.    . colesevelam (WELCHOL) 625 MG tablet TAKE 1 TABLET BY MOUTH TWICE DAILY WITH MEALS 30 tablet 0  . Cranberry 1000 MG CAPS Take by mouth.    . Cyanocobalamin (VITAMIN B 12 PO) Take by mouth.    . diclofenac sodium (VOLTAREN) 1 % GEL Apply 1 application topically 2 (two) times daily as needed.  0  . famotidine (PEPCID) 20 MG tablet Take 20 mg by mouth 2 (two) times daily.    Javier Docker Oil (OMEGA-3) 500 MG CAPS Take by mouth.    . Multiple Vitamin (MULTIVITAMIN) capsule Take 1 capsule by mouth daily.    . nortriptyline (PAMELOR) 10 MG capsule TAKE 1-2 CAPSULES AT BEDTIME  1  . oxyCODONE-acetaminophen (PERCOCET/ROXICET) 5-325 MG tablet at bedtime.   0  . pregabalin (LYRICA) 150 MG capsule Take 150 mg by mouth in the morning, at noon, and at bedtime.      No current facility-administered medications on file prior to visit.   Allergies  Allergen Reactions  . Sulfa Antibiotics Anaphylaxis  . Morphine And Related Itching  . Voltaren [Diclofenac] Hives   Family History  Problem Relation Age of Onset  . Diabetes Mother   . Hypertension Mother   . Breast  cancer Mother   . Colon polyps Mother   . Heart disease Mother   . Prostate cancer Father   . Hypertension Maternal Grandmother   . Breast cancer Maternal Grandmother        ? don't actually know but she had a breast removed  . Heart disease Maternal Grandmother   . Other Sister        Breast tumors  . Colon cancer Neg Hx   . Esophageal cancer Neg Hx   . Stomach cancer Neg Hx   . Rectal cancer Neg Hx     PE: BP 130/70   Pulse 64   Ht 5\' 5"  (1.651 m)   Wt 183 lb (83 kg)   SpO2 96%   BMI 30.45 kg/m  Wt Readings from Last 3 Encounters:  03/28/20 183 lb (83 kg)  08/03/18 214 lb 12.8 oz (97.4 kg)  06/29/18 213 lb (96.6 kg)   Constitutional: overweight, in NAD Eyes: PERRLA, EOMI, no exophthalmos, no lid lag, no stare ENT: moist mucous membranes, no thyromegaly, no thyroid bruits, no cervical lymphadenopathy Cardiovascular: RRR, + 1/6 SEM, no RG Respiratory: CTA B Gastrointestinal: abdomen soft, NT, ND, BS+ Musculoskeletal: no deformities, strength intact in all 4 Skin: moist, warm, no rashes Neurological: no tremor with outstretched hands, DTR normal in all 4  ASSESSMENT: 1.Multiple thyroid nodules  2.   Thyrotoxicosis  PLAN:  1.  Thyroid nodules -Patient has a history of an enlarged thyroid since approximately 2016, when this was palpated by PCP.  She had a thyroid ultrasound checked recently in 07/2019 and this showed 3 large nodules.  2 of them were biopsied in 08/2019 by previous endocrinologist, in office.  The right thyroid nodule biopsy showed a Hurthle cell neoplasm/adenoma (Bethesda category 4), however, Afirma molecular marker test returned benign.  The left thyroid nodule biopsy was inconclusive due to scant material. At that time, patient was referred to ENT and she was advised that she needed total thyroidectomy.  She was reticent to proceed with this and saw Dr. Harlow Asa for a second opinion.  He ordered to new biopsies,  of the right inferior nodule and a repeat  biopsy of the left nodule and they returned benign. He advised her that there is no clear indication for surgery and referred her to me.  Of note, she also had thyrotoxicosis (please see below). -At this visit, we reviewed the biopsy results and I explained what they mean. -I agree with Dr. Harlow Asa that she does not need surgery at this point especially since she does not have any neck compression symptoms -I recommended a repeat ultrasound in a year from the previous  -Patient is in complete agreement.  2.  Thyrotoxicosis Patient with low TSH since at least 2019, without clear thyrotoxic sxs.She does describe significant weight loss, however, she has been on the low-carb diet for several months now.  She also has both heat and cold intolerance, but no hyperdefecation, palpitations, anxiety.  -We discussed about possible exogenous causes for the low TSH.  She has been on high-dose biotin for quite a long time, and she believes she has been on this when the thyroid tests have been checked in the past.  I explained that biotin can lower the TSH by interfering with the assay.  We will need to repeat the thyroid tests after she has been off biotin for at least a week. -If the thyroid tests remain abnormal, we discussed that possible causes of thyrotoxicosis are:  Marland Kitchen Graves ds   . Thyroiditis . toxic multinodular goiter/ toxic adenoma  - will check the TSH, fT3 and fT4 and also add thyroid stimulating antibodies to screen for Graves' disease.  - If the tests remain abnormal, we may need an uptake and scan to differentiate between the 3 above possible etiologies  - we discussed about possible modalities of treatment for the above conditions, to include methimazole use, radioactive iodine ablation or (last resort) surgery. - I do not feel that we need to add beta blockers at this time, since she is not tachycardic or tremulous - no signs of Graves' ophthalmopathy: she does not have any double vision, blurry  vision, eye pain, chemosis. - I advised her to join my chart to communicate easier - RTC in 3-4 months, but possibly sooner for repeat labs  Orders Placed This Encounter  Procedures  . TSH  . T4, free  . T3, free  . Thyroid stimulating immunoglobulin   Component     Latest Ref Rng & Units 04/04/2020  TSH     0.35 - 4.50 uIU/mL 0.03 (L)  TSI     <140 % baseline <89  Triiodothyronine,Free,Serum     2.3 - 4.2 pg/mL 3.5  T4,Free(Direct)     0.60 - 1.60 ng/dL 0.84  TSH still suppressed, TSI's not elevated.  We will go ahead and check a thyroid uptake and scan.  Philemon Kingdom, MD PhD [facility] End[facility]

## 2020-04-02 DIAGNOSIS — Z6832 Body mass index (BMI) 32.0-32.9, adult: Secondary | ICD-10-CM | POA: Diagnosis not present

## 2020-04-04 ENCOUNTER — Other Ambulatory Visit: Payer: Self-pay

## 2020-04-04 ENCOUNTER — Other Ambulatory Visit (INDEPENDENT_AMBULATORY_CARE_PROVIDER_SITE_OTHER): Payer: Medicare HMO

## 2020-04-04 DIAGNOSIS — E0590 Thyrotoxicosis, unspecified without thyrotoxic crisis or storm: Secondary | ICD-10-CM | POA: Diagnosis not present

## 2020-04-04 DIAGNOSIS — Z03818 Encounter for observation for suspected exposure to other biological agents ruled out: Secondary | ICD-10-CM | POA: Diagnosis not present

## 2020-04-04 DIAGNOSIS — E042 Nontoxic multinodular goiter: Secondary | ICD-10-CM

## 2020-04-04 LAB — T3, FREE: T3, Free: 3.5 pg/mL (ref 2.3–4.2)

## 2020-04-04 LAB — TSH: TSH: 0.03 u[IU]/mL — ABNORMAL LOW (ref 0.35–4.50)

## 2020-04-04 LAB — T4, FREE: Free T4: 0.84 ng/dL (ref 0.60–1.60)

## 2020-04-09 ENCOUNTER — Other Ambulatory Visit: Payer: Self-pay | Admitting: Gastroenterology

## 2020-04-09 LAB — THYROID STIMULATING IMMUNOGLOBULIN: TSI: <89 % baseline (ref <140)

## 2020-04-09 NOTE — Addendum Note (Signed)
Addended by: Philemon Kingdom on: 04/09/2020 04:54 PM   Modules accepted: Orders

## 2020-04-10 ENCOUNTER — Telehealth: Payer: Self-pay

## 2020-04-10 NOTE — Telephone Encounter (Signed)
Notified patient of message from Dr. Gherghe, patient expressed understanding and agreement. No further questions.  

## 2020-04-10 NOTE — Telephone Encounter (Signed)
Left message for patient to return our call at [phone].  

## 2020-04-10 NOTE — Telephone Encounter (Signed)
-----   Message from Philemon Kingdom, MD sent at 04/09/2020  4:55 PM EDT ----- Lenna Sciara, can you please call pt: TSH still suppressed, while the rest of the thyroid hormones are normal.  Graves' antibodies not elevated.  In this case, I would recommend to check a thyroid uptake and scan, as we discussed.  I ordered this to be done at [facility].  Please also give her the telephone number for the nuclear medicine department, just in case she is not called within the next few days.

## 2020-04-30 DIAGNOSIS — Z981 Arthrodesis status: Secondary | ICD-10-CM | POA: Diagnosis not present

## 2020-04-30 DIAGNOSIS — G629 Polyneuropathy, unspecified: Secondary | ICD-10-CM | POA: Diagnosis not present

## 2020-04-30 DIAGNOSIS — M5136 Other intervertebral disc degeneration, lumbar region: Secondary | ICD-10-CM | POA: Diagnosis not present

## 2020-04-30 DIAGNOSIS — I83813 Varicose veins of bilateral lower extremities with pain: Secondary | ICD-10-CM | POA: Diagnosis not present

## 2020-04-30 DIAGNOSIS — M47816 Spondylosis without myelopathy or radiculopathy, lumbar region: Secondary | ICD-10-CM | POA: Diagnosis not present

## 2020-04-30 DIAGNOSIS — E669 Obesity, unspecified: Secondary | ICD-10-CM | POA: Diagnosis not present

## 2020-04-30 DIAGNOSIS — Z79891 Long term (current) use of opiate analgesic: Secondary | ICD-10-CM | POA: Diagnosis not present

## 2020-05-03 DIAGNOSIS — Z1152 Encounter for screening for COVID-19: Secondary | ICD-10-CM | POA: Diagnosis not present

## 2020-05-07 DIAGNOSIS — Z1331 Encounter for screening for depression: Secondary | ICD-10-CM | POA: Diagnosis not present

## 2020-05-07 DIAGNOSIS — Z0000 Encounter for general adult medical examination without abnormal findings: Secondary | ICD-10-CM | POA: Diagnosis not present

## 2020-05-07 DIAGNOSIS — Z139 Encounter for screening, unspecified: Secondary | ICD-10-CM | POA: Diagnosis not present

## 2020-05-07 DIAGNOSIS — Z136 Encounter for screening for cardiovascular disorders: Secondary | ICD-10-CM | POA: Diagnosis not present

## 2020-05-07 DIAGNOSIS — Z1339 Encounter for screening examination for other mental health and behavioral disorders: Secondary | ICD-10-CM | POA: Diagnosis not present

## 2020-05-07 DIAGNOSIS — Z7189 Other specified counseling: Secondary | ICD-10-CM | POA: Diagnosis not present

## 2020-05-09 DIAGNOSIS — I10 Essential (primary) hypertension: Secondary | ICD-10-CM | POA: Diagnosis not present

## 2020-05-09 DIAGNOSIS — R69 Illness, unspecified: Secondary | ICD-10-CM | POA: Diagnosis not present

## 2020-05-09 DIAGNOSIS — Z6831 Body mass index (BMI) 31.0-31.9, adult: Secondary | ICD-10-CM | POA: Diagnosis not present

## 2020-05-09 DIAGNOSIS — E0590 Thyrotoxicosis, unspecified without thyrotoxic crisis or storm: Secondary | ICD-10-CM | POA: Diagnosis not present

## 2020-05-16 ENCOUNTER — Encounter (HOSPITAL_COMMUNITY)
Admission: RE | Admit: 2020-05-16 | Discharge: 2020-05-16 | Disposition: A | Discharge status: Home / Self Care | Payer: Medicare HMO | Source: Ambulatory Visit | Attending: Internal Medicine | Admitting: Internal Medicine

## 2020-05-16 ENCOUNTER — Other Ambulatory Visit: Payer: Self-pay

## 2020-05-16 DIAGNOSIS — E0590 Thyrotoxicosis, unspecified without thyrotoxic crisis or storm: Secondary | ICD-10-CM | POA: Insufficient documentation

## 2020-05-16 MED ORDER — SODIUM IODIDE I-123 7.4 MBQ CAPS
437.0000 | ORAL_CAPSULE | Freq: Once | ORAL | Status: AC
  Administered 2020-05-16: 437 via ORAL

## 2020-05-17 ENCOUNTER — Encounter (HOSPITAL_COMMUNITY)
Admission: RE | Admit: 2020-05-17 | Discharge: 2020-05-17 | Disposition: A | Discharge status: Home / Self Care | Payer: Medicare HMO | Source: Ambulatory Visit | Attending: Internal Medicine | Admitting: Internal Medicine

## 2020-05-17 DIAGNOSIS — E042 Nontoxic multinodular goiter: Secondary | ICD-10-CM | POA: Diagnosis not present

## 2020-05-17 DIAGNOSIS — E0590 Thyrotoxicosis, unspecified without thyrotoxic crisis or storm: Secondary | ICD-10-CM | POA: Diagnosis not present

## 2020-05-31 DIAGNOSIS — M47816 Spondylosis without myelopathy or radiculopathy, lumbar region: Secondary | ICD-10-CM | POA: Diagnosis not present

## 2020-05-31 DIAGNOSIS — Z1389 Encounter for screening for other disorder: Secondary | ICD-10-CM | POA: Diagnosis not present

## 2020-05-31 DIAGNOSIS — G629 Polyneuropathy, unspecified: Secondary | ICD-10-CM | POA: Diagnosis not present

## 2020-05-31 DIAGNOSIS — Z79891 Long term (current) use of opiate analgesic: Secondary | ICD-10-CM | POA: Diagnosis not present

## 2020-05-31 DIAGNOSIS — I83813 Varicose veins of bilateral lower extremities with pain: Secondary | ICD-10-CM | POA: Diagnosis not present

## 2020-05-31 DIAGNOSIS — E669 Obesity, unspecified: Secondary | ICD-10-CM | POA: Diagnosis not present

## 2020-05-31 DIAGNOSIS — M5136 Other intervertebral disc degeneration, lumbar region: Secondary | ICD-10-CM | POA: Diagnosis not present

## 2020-05-31 DIAGNOSIS — Z981 Arthrodesis status: Secondary | ICD-10-CM | POA: Diagnosis not present

## 2020-06-04 DIAGNOSIS — Z6831 Body mass index (BMI) 31.0-31.9, adult: Secondary | ICD-10-CM | POA: Diagnosis not present

## 2020-06-13 DIAGNOSIS — R32 Unspecified urinary incontinence: Secondary | ICD-10-CM | POA: Diagnosis not present

## 2020-06-20 DIAGNOSIS — I10 Essential (primary) hypertension: Secondary | ICD-10-CM | POA: Diagnosis not present

## 2020-06-20 DIAGNOSIS — R69 Illness, unspecified: Secondary | ICD-10-CM | POA: Diagnosis not present

## 2020-06-20 DIAGNOSIS — Z6832 Body mass index (BMI) 32.0-32.9, adult: Secondary | ICD-10-CM | POA: Diagnosis not present

## 2020-06-25 DIAGNOSIS — Z1152 Encounter for screening for COVID-19: Secondary | ICD-10-CM | POA: Diagnosis not present

## 2020-06-28 DIAGNOSIS — Z981 Arthrodesis status: Secondary | ICD-10-CM | POA: Diagnosis not present

## 2020-06-28 DIAGNOSIS — M5136 Other intervertebral disc degeneration, lumbar region: Secondary | ICD-10-CM | POA: Diagnosis not present

## 2020-06-28 DIAGNOSIS — I83813 Varicose veins of bilateral lower extremities with pain: Secondary | ICD-10-CM | POA: Diagnosis not present

## 2020-06-28 DIAGNOSIS — E669 Obesity, unspecified: Secondary | ICD-10-CM | POA: Diagnosis not present

## 2020-06-28 DIAGNOSIS — Z79891 Long term (current) use of opiate analgesic: Secondary | ICD-10-CM | POA: Diagnosis not present

## 2020-06-28 DIAGNOSIS — Z1389 Encounter for screening for other disorder: Secondary | ICD-10-CM | POA: Diagnosis not present

## 2020-06-28 DIAGNOSIS — G629 Polyneuropathy, unspecified: Secondary | ICD-10-CM | POA: Diagnosis not present

## 2020-06-28 DIAGNOSIS — M47816 Spondylosis without myelopathy or radiculopathy, lumbar region: Secondary | ICD-10-CM | POA: Diagnosis not present

## 2020-07-25 DIAGNOSIS — Z1152 Encounter for screening for COVID-19: Secondary | ICD-10-CM | POA: Diagnosis not present

## 2020-07-26 DIAGNOSIS — I83813 Varicose veins of bilateral lower extremities with pain: Secondary | ICD-10-CM | POA: Diagnosis not present

## 2020-07-26 DIAGNOSIS — Z981 Arthrodesis status: Secondary | ICD-10-CM | POA: Diagnosis not present

## 2020-07-26 DIAGNOSIS — E669 Obesity, unspecified: Secondary | ICD-10-CM | POA: Diagnosis not present

## 2020-07-26 DIAGNOSIS — M47816 Spondylosis without myelopathy or radiculopathy, lumbar region: Secondary | ICD-10-CM | POA: Diagnosis not present

## 2020-07-26 DIAGNOSIS — Z79891 Long term (current) use of opiate analgesic: Secondary | ICD-10-CM | POA: Diagnosis not present

## 2020-07-26 DIAGNOSIS — M5136 Other intervertebral disc degeneration, lumbar region: Secondary | ICD-10-CM | POA: Diagnosis not present

## 2020-07-26 DIAGNOSIS — G629 Polyneuropathy, unspecified: Secondary | ICD-10-CM | POA: Diagnosis not present

## 2020-08-01 ENCOUNTER — Other Ambulatory Visit: Payer: Self-pay

## 2020-08-01 ENCOUNTER — Encounter: Payer: Self-pay | Admitting: Internal Medicine

## 2020-08-01 ENCOUNTER — Ambulatory Visit: Payer: Medicare HMO | Admitting: Internal Medicine

## 2020-08-01 VITALS — BP 128/80 | HR 90 | Ht 65.0 in | Wt 187.0 lb

## 2020-08-01 DIAGNOSIS — E0590 Thyrotoxicosis, unspecified without thyrotoxic crisis or storm: Secondary | ICD-10-CM

## 2020-08-01 DIAGNOSIS — E042 Nontoxic multinodular goiter: Secondary | ICD-10-CM

## 2020-08-01 LAB — T4, FREE: Free T4: 0.7 ng/dL (ref 0.60–1.60)

## 2020-08-01 LAB — T3, FREE: T3, Free: 4.3 pg/mL — ABNORMAL HIGH (ref 2.3–4.2)

## 2020-08-01 LAB — TSH: TSH: 0.07 u[IU]/mL — ABNORMAL LOW (ref 0.35–4.50)

## 2020-08-01 NOTE — Patient Instructions (Signed)
Please stop at the lab.  Please come back for a follow-up appointment in 4 months.   

## 2020-08-01 NOTE — Progress Notes (Signed)
Patient ID: Leslie Werner, female   DOB: 30-Oct-1951, 69 y.o.   MRN: [id number]   This visit occurred during the SARS-CoV-2 public health emergency.  Safety protocols were in place, including screening questions prior to the visit, additional usage of staff PPE, and extensive cleaning of exam room while observing appropriate contact time as indicated for disinfecting solutions.   HPI  Leslie Werner is a 69 y.o.-year-old female, referred by Dr. Harlow Asa, for evaluation and management of thyrotoxicosis and thyroid nodules.  Last visit 4 months ago.  Reviewed history: Patient was diagnosed with thyroid nodules in 2016 after PCP palpated her thyroid. She saw an endocrinologist >> had bx'es in 08/2019 (see below - one inconclusive) >> She was referred to ENT >> suggested total thyroidectomy >> saw Dr. Harlow Asa for a second opinion >> he did not think patient needed surgery at that time >> referred to me.  At our last visit, we reviewed together available investigation of her thyroid nodules and I agreed that she did not need surgery.  Reviewed the report of her thyroid ultrasound and FNAs: Thyroid ultrasound (07/26/2019): 3 dominant nodules (only report, no images available:  Right superior 2.4 x 1.2 x 1.2 cm nodule, solid/almost completely solid (2), with punctate echogenic foci (3), and macrocalcifications (1).  ACR TI-RADS total points: 7.  Biopsy recommended.  Right inferior 2.5 x 2.2 x 1.6 cm nodule, solid/almost completely solid (2), hypoechoic (2), ACR TI-RADS total points: 4.  Biopsy recommended.  Left mid 2.7 x 1.4 x 1.3 cm nodule, solid/almost completely solid (2), hypoechoic (2), macrocalcifications (1), ACR TI-RADS total points: 5.  Biopsy recommended.  0.6 x 0.6 x 0.5 cm anechoic cyst within the superior pole of the left lobe containing internal echogenic foci remain -artifact compatible with benign colloid.  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  Thyrotoxicosis:  Reviewed her TFTs: Lab Results  Component Value Date   TSH 0.03 (L) 04/04/2020   TSH 0.07 (L) 02/01/2018   FREET4 0.84 04/04/2020   T3FREE 3.5 04/04/2020  01/25/2020: TSH 0.033 (0.45-4.5)  Her TSI antibodies were not elevated: Lab Results  Component Value Date   TSI <89 04/04/2020   At last visit we ordered a thyroid uptake and scan (05/17/2020): Toxic nodules bilaterally with elevated 24-hour uptake: Hot nodules are identified within the mid LEFT lobe and at the superior and inferior poles of the RIGHT thyroid lobe, largest RIGHT inferior.  Suppression of uptake in remaining thyroid gland. 4 hour I-123 uptake = 10.1% (normal 5-20%) 24 hour I-123 uptake = 35.4% (normal 10-30%)  Since she was not very symptomatic and she had only mildly increased uptake, we decided to follow her clinically at the time.  At last visit she mentioned intentional weight loss (limiting carbs-lost 38 pounds in the year prior to our last visit).  Since last visit she gained 4 pounds and she relaxed her diet.  Pt does not have a FH of thyroid ds. No FH of thyroid cancer. No h/o radiation tx to head or neck.  No seaweed or kelp. No recent contrast studies. No herbal supplements.  No recent steroids use.  Stopped Biotin.  Pt. also has a history of nephrolithiasis  - s/p sx, HL, HTN, GERD.  She had cervical spine surgery.  ROS: Constitutional: + weight gain/no weight loss, no fatigue, no subjective hyperthermia, no subjective hypothermia Eyes: no blurry vision, no xerophthalmia ENT: no sore throat, + see HPI Cardiovascular: no CP/no SOB/no palpitations/no leg  swelling Respiratory: no cough/no SOB/no wheezing Gastrointestinal: no N/no V/no D/no C/+ acid reflux Musculoskeletal: no muscle aches/no joint aches Skin: no rashes, no hair loss Neurological: no tremors/no numbness/no tingling/no dizziness, + headaches  I reviewed pt's medications, allergies, PMH, social hx, family hx, and changes were documented in the history of present illness. Otherwise, unchanged from my initial visit note.  Past Medical History:  Diagnosis Date  . Anal lesion   . Arthritis   . Back pain   . Bulging lumbar disc   . Cholelithiasis   . Cyst of spleen   . Gastric polyps   . GERD (gastroesophageal reflux disease)   . History of colon polyps 06/29/2018   2 9-12 mm polps transverse and ascending colon, 14 mm sigmoid colon  . History of kidney stones   . History of rectal bleeding   . Hypertension   . Hypertriglyceridemia   . Hypothyroidism   . IBS (irritable bowel syndrome)   . Incontinence of feces with fecal urgency   . Internal hemorrhoids    grade 2-3  . Migraine   . Neuromuscular disorder ([place])   . Peripheral neuropathy    due to antibiotic use  . Pinched nerve    L4-L5  . Pneumonia 01/2017  . Spinal stenosis   . Thyroid goiter   . Wears glasses    Past Surgical History:  Procedure Laterality Date  . APPENDECTOMY  2018  . CERVICAL FUSION  2016  . COLONOSCOPY W/ POLYPECTOMY  06/29/2018  . ESOPHAGOGASTRODUODENOSCOPY  02/10/2013   in [place], [place], Dr. Gerri Spore  . HEMORRHOID SURGERY N/A 08/03/2018   Procedure: HEMORRHOIDAL PEXY;  Surgeon: Leighton Ruff, MD;  Location: [facility];  Service: General;  Laterality: N/A;  . KIDNEY STONE SURGERY     x 2  . LITHOTRIPSY  2008   2-3 litho  . NEPHROSTOMY    . PARTIAL HYSTERECTOMY  2000  . RECTAL EXAM UNDER ANESTHESIA N/A 08/03/2018   Procedure: ANAL  EXAM UNDER ANESTHESIA;  Surgeon: Leighton Ruff, MD;  Location: [place];  Service: General;   Laterality: N/A;   Social History   Socioeconomic History  . Marital status: Married    Spouse name: Not on file  . Number of children: 3  . Years of education: Not on file  . Highest education level: Not on file  Occupational History  . Occupation: CNA    Comment: [employer]  Tobacco Use  . Smoking status: Former Smoker    Packs/day: 1.00    Years: 15.00    Pack years: 15.00    Types: Cigarettes    Quit date: 2005    Years since quitting: 16.6  . Smokeless tobacco: Never Used  Vaping Use  . Vaping Use: Never used  Substance and Sexual Activity  . Alcohol use: Yes    Comment: rare  . Drug use: No  . Sexual activity: Yes    Birth control/protection: Surgical  Other Topics Concern  . Not on file  Social History Narrative  . Not on file   Social Determinants of Health   Financial Resource Strain:   . Difficulty  of Paying Living Expenses: Not on file  Food Insecurity:   . Worried About Charity fundraiser in the Last Year: Not on file  . Ran Out of Food in the Last Year: Not on file  Transportation Needs:   . Lack of Transportation (Medical): Not on file  . Lack of Transportation (Non-Medical): Not on file  Physical Activity:   . Days of Exercise per Week: Not on file  . Minutes of Exercise per Session: Not on file  Stress:   . Feeling of Stress : Not on file  Social Connections:   . Frequency of Communication with Friends and Family: Not on file  . Frequency of Social Gatherings with Friends and Family: Not on file  . Attends Religious Services: Not on file  . Active Member of Clubs or Organizations: Not on file  . Attends Archivist Meetings: Not on file  . Marital Status: Not on file  Intimate Partner Violence:   . Fear of Current or Ex-Partner: Not on file  . Emotionally Abused: Not on file  . Physically Abused: Not on file  . Sexually Abused: Not on file   Current Outpatient Medications on File Prior to Visit  Medication Sig Dispense Refill   . amLODipine-benazepril (LOTREL) 10-40 MG capsule Take 1 capsule by mouth daily.  1  . aspirin 81 MG chewable tablet Chew by mouth daily.    . Biotin 10 MG CAPS Take by mouth.    . Cholecalciferol (VITAMIN D PO) Take by mouth.    . colesevelam (WELCHOL) 625 MG tablet TAKE 1 TABLET BY MOUTH TWICE DAILY WITH MEALS 30 tablet 0  . Cranberry 1000 MG CAPS Take by mouth.    . Cyanocobalamin (VITAMIN B 12 PO) Take by mouth.    . diclofenac sodium (VOLTAREN) 1 % GEL Apply 1 application topically 2 (two) times daily as needed.  0  . famotidine (PEPCID) 20 MG tablet Take 20 mg by mouth 2 (two) times daily.    Javier Docker Oil (OMEGA-3) 500 MG CAPS Take by mouth.    . Multiple Vitamin (MULTIVITAMIN) capsule Take 1 capsule by mouth daily.    . nortriptyline (PAMELOR) 10 MG capsule TAKE 1-2 CAPSULES AT BEDTIME  1  . oxyCODONE-acetaminophen (PERCOCET/ROXICET) 5-325 MG tablet at bedtime.   0  . pregabalin (LYRICA) 150 MG capsule Take 150 mg by mouth in the morning, at noon, and at bedtime.      No current facility-administered medications on file prior to visit.   Allergies  Allergen Reactions  . Sulfa Antibiotics Anaphylaxis  . Morphine And Related Itching  . Voltaren [Diclofenac] Hives   Family History  Problem Relation Age of Onset  . Diabetes Mother   . Hypertension Mother   . Breast cancer Mother   . Colon polyps Mother   . Heart disease Mother   . Prostate cancer Father   . Hypertension Maternal Grandmother   . Breast cancer Maternal Grandmother        ? don't actually know but she had a breast removed  . Heart disease Maternal Grandmother   . Other Sister        Breast tumors  . Colon cancer Neg Hx   . Esophageal cancer Neg Hx   . Stomach cancer Neg Hx   . Rectal cancer Neg Hx     PE: BP 128/80   Pulse 90   Ht 5\' 5"  (1.651 m)   Wt 187 lb (84.8 kg)  SpO2 95%   BMI 31.12 kg/m  Wt Readings from Last 3 Encounters:  08/01/20 187 lb (84.8 kg)  03/28/20 183 lb (83 kg)  08/03/18  214 lb 12.8 oz (97.4 kg)   Constitutional: overweight, in NAD Eyes: PERRLA, EOMI, no exophthalmos ENT: moist mucous membranes, no thyromegaly, no cervical lymphadenopathy Cardiovascular: RRR, No RG, +1/6 SEM Respiratory: CTA B Gastrointestinal: abdomen soft, NT, ND, BS+ Musculoskeletal: no deformities, strength intact in all 4 Skin: moist, warm, no rashes Neurological: no tremor with outstretched hands, DTR normal in all 4  ASSESSMENT: 1. Multiple thyroid nodules  2.   Thyrotoxicosis  PLAN:  1.  Thyroid nodules -Patient has a history of an enlarged thyroid since approximately 2016, when this was palpated by her PCP.  She had a thyroid ultrasound checked in 07/2019 and this showed 3 large nodules.  2 of them were biopsied in 08/2019 by previous endocrinologist, in the office.  The right thyroid nodule biopsy showed a Hurthle cell neoplasm/adenoma (Bethesda category 4), however, Afirma molecular marker test returned benign.  The left thyroid nodule biopsy was inconclusive due to scant material.  At that time, patient was referred to ENT and she was advised that she needed total thyroidectomy.  She was reticent to proceed with this and saw Dr. Harlow Asa for a second opinion.  He ordered 2 new biopsies, one of the right inferior nodule and a repeat biopsy of the left nodule.  Both returned benign.  He advised her that there was no clear indication for surgery.  She came to see me as a second opinion I agreed with Dr. Harlow Asa that she did not need surgery at the moment, especially since she had no neck compression symptoms. -We did discuss about repeating a thyroid ultrasound a year from the previous.  We will order this now. -She denies neck compression symptoms - some dysphagia with dry foods.  2.  Thyrotoxicosis -Patient with low TSH since at least 2019, without clear thyrotoxic symptoms.  She did have significant weight loss but this was intentional, on a low-carb diet for several months.  She  also had both heat and cold intolerance and insomnia, but these were chronic and not associated with palpitations, anxiety. -At last visit, I suggested a thyroid uptake and scan, which she had in 04/2020.  This showed hot nodules bilaterally and a mildly increased 24-hour iodine uptake.  We discussed that this is a mild condition, and, in the absence of symptoms and with normal free thyroid hormones, for now, we can follow her without intervention. -We did discuss at that time and again now the definitive treatment for the condition is actually RAI treatment or surgery.  Methimazole can be used but this should be long-term, so usually the previous 2 treatments are preferred.  I explained that RAI treatment may also shrink the thyroid gland and possibly relieve her dysphagia, if this is related to thyroid compression.  She would be open to RAI treatment, if needed. -For now, we discussed about checking a TSH, free T4, and free T3 and decide about further plan depending on the results. -For now, I do not feel that we need to add beta-blockers since she is not tachycardic or tremulous (HR 90 at the beginning of the appt as she rushed here, but decreased at the end to 82) -I will have her come back in 4 months but possibly sooner for labs   Orders Placed This Encounter  Procedures  . US THYROID   Component  Latest Ref Rng & Units 08/01/2020  TSH     0.35 - 4.50 uIU/mL 0.07 (L)  Triiodothyronine,Free,Serum     2.3 - 4.2 pg/mL 4.3 (H)  T4,Free(Direct)     0.60 - 1.60 ng/dL 0.70   TSH is still suppressed and free T3 slightly high.  Would suggest RAI treatment.  Will discuss with the patient.  Philemon Kingdom, MD PhD [facility] Endocrinology

## 2020-08-06 DIAGNOSIS — Z1152 Encounter for screening for COVID-19: Secondary | ICD-10-CM | POA: Diagnosis not present

## 2020-08-22 DIAGNOSIS — Z6832 Body mass index (BMI) 32.0-32.9, adult: Secondary | ICD-10-CM | POA: Diagnosis not present

## 2020-08-22 DIAGNOSIS — F329 Major depressive disorder, single episode, unspecified: Secondary | ICD-10-CM | POA: Diagnosis not present

## 2020-08-22 DIAGNOSIS — R69 Illness, unspecified: Secondary | ICD-10-CM | POA: Diagnosis not present

## 2020-08-22 DIAGNOSIS — Z23 Encounter for immunization: Secondary | ICD-10-CM | POA: Diagnosis not present

## 2020-08-23 DIAGNOSIS — M47816 Spondylosis without myelopathy or radiculopathy, lumbar region: Secondary | ICD-10-CM | POA: Diagnosis not present

## 2020-08-23 DIAGNOSIS — M5136 Other intervertebral disc degeneration, lumbar region: Secondary | ICD-10-CM | POA: Diagnosis not present

## 2020-08-23 DIAGNOSIS — E669 Obesity, unspecified: Secondary | ICD-10-CM | POA: Diagnosis not present

## 2020-08-23 DIAGNOSIS — G629 Polyneuropathy, unspecified: Secondary | ICD-10-CM | POA: Diagnosis not present

## 2020-08-23 DIAGNOSIS — Z981 Arthrodesis status: Secondary | ICD-10-CM | POA: Diagnosis not present

## 2020-08-23 DIAGNOSIS — I83813 Varicose veins of bilateral lower extremities with pain: Secondary | ICD-10-CM | POA: Diagnosis not present

## 2020-08-23 DIAGNOSIS — Z79891 Long term (current) use of opiate analgesic: Secondary | ICD-10-CM | POA: Diagnosis not present

## 2020-09-20 DIAGNOSIS — M47816 Spondylosis without myelopathy or radiculopathy, lumbar region: Secondary | ICD-10-CM | POA: Diagnosis not present

## 2020-09-20 DIAGNOSIS — Z981 Arthrodesis status: Secondary | ICD-10-CM | POA: Diagnosis not present

## 2020-09-20 DIAGNOSIS — G629 Polyneuropathy, unspecified: Secondary | ICD-10-CM | POA: Diagnosis not present

## 2020-09-20 DIAGNOSIS — Z79891 Long term (current) use of opiate analgesic: Secondary | ICD-10-CM | POA: Diagnosis not present

## 2020-09-20 DIAGNOSIS — I83813 Varicose veins of bilateral lower extremities with pain: Secondary | ICD-10-CM | POA: Diagnosis not present

## 2020-09-20 DIAGNOSIS — M5136 Other intervertebral disc degeneration, lumbar region: Secondary | ICD-10-CM | POA: Diagnosis not present

## 2020-09-20 DIAGNOSIS — E669 Obesity, unspecified: Secondary | ICD-10-CM | POA: Diagnosis not present

## 2020-10-11 DIAGNOSIS — M47816 Spondylosis without myelopathy or radiculopathy, lumbar region: Secondary | ICD-10-CM | POA: Diagnosis not present

## 2020-10-11 DIAGNOSIS — G894 Chronic pain syndrome: Secondary | ICD-10-CM | POA: Diagnosis not present

## 2020-10-11 DIAGNOSIS — I83813 Varicose veins of bilateral lower extremities with pain: Secondary | ICD-10-CM | POA: Diagnosis not present

## 2020-10-11 DIAGNOSIS — M5136 Other intervertebral disc degeneration, lumbar region: Secondary | ICD-10-CM | POA: Diagnosis not present

## 2020-10-11 DIAGNOSIS — E669 Obesity, unspecified: Secondary | ICD-10-CM | POA: Diagnosis not present

## 2020-10-11 DIAGNOSIS — G629 Polyneuropathy, unspecified: Secondary | ICD-10-CM | POA: Diagnosis not present

## 2020-10-11 DIAGNOSIS — Z79891 Long term (current) use of opiate analgesic: Secondary | ICD-10-CM | POA: Diagnosis not present

## 2020-10-11 DIAGNOSIS — Z981 Arthrodesis status: Secondary | ICD-10-CM | POA: Diagnosis not present

## 2020-10-24 DIAGNOSIS — H524 Presbyopia: Secondary | ICD-10-CM | POA: Diagnosis not present

## 2020-10-24 DIAGNOSIS — H2513 Age-related nuclear cataract, bilateral: Secondary | ICD-10-CM | POA: Diagnosis not present

## 2020-10-24 DIAGNOSIS — H02839 Dermatochalasis of unspecified eye, unspecified eyelid: Secondary | ICD-10-CM | POA: Diagnosis not present

## 2020-10-24 DIAGNOSIS — H43393 Other vitreous opacities, bilateral: Secondary | ICD-10-CM | POA: Diagnosis not present

## 2020-10-26 DIAGNOSIS — H2513 Age-related nuclear cataract, bilateral: Secondary | ICD-10-CM | POA: Diagnosis not present

## 2020-10-26 DIAGNOSIS — H02839 Dermatochalasis of unspecified eye, unspecified eyelid: Secondary | ICD-10-CM | POA: Diagnosis not present

## 2020-10-26 DIAGNOSIS — H524 Presbyopia: Secondary | ICD-10-CM | POA: Diagnosis not present

## 2020-10-26 DIAGNOSIS — H43393 Other vitreous opacities, bilateral: Secondary | ICD-10-CM | POA: Diagnosis not present

## 2020-11-07 DIAGNOSIS — Z1152 Encounter for screening for COVID-19: Secondary | ICD-10-CM | POA: Diagnosis not present

## 2020-11-08 DIAGNOSIS — E669 Obesity, unspecified: Secondary | ICD-10-CM | POA: Diagnosis not present

## 2020-11-08 DIAGNOSIS — M5136 Other intervertebral disc degeneration, lumbar region: Secondary | ICD-10-CM | POA: Diagnosis not present

## 2020-11-08 DIAGNOSIS — G629 Polyneuropathy, unspecified: Secondary | ICD-10-CM | POA: Diagnosis not present

## 2020-11-08 DIAGNOSIS — Z981 Arthrodesis status: Secondary | ICD-10-CM | POA: Diagnosis not present

## 2020-11-08 DIAGNOSIS — I83813 Varicose veins of bilateral lower extremities with pain: Secondary | ICD-10-CM | POA: Diagnosis not present

## 2020-11-08 DIAGNOSIS — Z79891 Long term (current) use of opiate analgesic: Secondary | ICD-10-CM | POA: Diagnosis not present

## 2020-11-08 DIAGNOSIS — M47816 Spondylosis without myelopathy or radiculopathy, lumbar region: Secondary | ICD-10-CM | POA: Diagnosis not present

## 2020-11-13 DIAGNOSIS — F419 Anxiety disorder, unspecified: Secondary | ICD-10-CM | POA: Diagnosis not present

## 2020-11-13 DIAGNOSIS — Z6832 Body mass index (BMI) 32.0-32.9, adult: Secondary | ICD-10-CM | POA: Diagnosis not present

## 2020-11-13 DIAGNOSIS — G43909 Migraine, unspecified, not intractable, without status migrainosus: Secondary | ICD-10-CM | POA: Diagnosis not present

## 2020-11-13 DIAGNOSIS — R69 Illness, unspecified: Secondary | ICD-10-CM | POA: Diagnosis not present

## 2020-12-05 ENCOUNTER — Ambulatory Visit: Payer: Medicare HMO | Admitting: Internal Medicine

## 2020-12-05 NOTE — Progress Notes (Deleted)
Patient ID: Leslie Werner, female   DOB: September 21, 1951, 70 y.o.   MRN: [id number]   This visit occurred during the SARS-CoV-2 public health emergency.  Safety protocols were in place, including screening questions prior to the visit, additional usage of staff PPE, and extensive cleaning of exam room while observing appropriate contact time as indicated for disinfecting solutions.   HPI  Leslie Werner is a 70 y.o.-year-old female, referred by Dr. Harlow Asa, for evaluation and management of thyrotoxicosis and thyroid nodules.  Last visit 4 months ago.  Reviewed history: Patient was diagnosed with thyroid nodules in 2016 after PCP palpated her thyroid. She saw an endocrinologist >> had bx'es in 08/2019 (see below - one inconclusive) >> She was referred to ENT >> suggested total thyroidectomy >> saw Dr. Harlow Asa for a second opinion >> he did not think patient needed surgery at that time >> referred to me.  At our last visit, we reviewed together available investigation of her thyroid nodules and I agreed that she did not need surgery.  Reviewed the report of her thyroid ultrasound and FNAs: Thyroid ultrasound (07/26/2019): 3 dominant nodules (only report, no images available:  Right superior 2.4 x 1.2 x 1.2 cm nodule, solid/almost completely solid (2), with punctate echogenic foci (3), and macrocalcifications (1).  ACR TI-RADS total points: 7.  Biopsy recommended.  Right inferior 2.5 x 2.2 x 1.6 cm nodule, solid/almost completely solid (2), hypoechoic (2), ACR TI-RADS total points: 4.  Biopsy recommended.  Left mid 2.7 x 1.4 x 1.3 cm nodule, solid/almost completely solid (2), hypoechoic (2), macrocalcifications (1), ACR TI-RADS total points: 5.  Biopsy recommended.  0.6 x 0.6 x 0.5 cm anechoic cyst within the superior pole of the left lobe containing internal echogenic foci remain -artifact compatible with benign colloid.  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), Afirma: Benign  Left mid 2.7 cm nodule: Scant material, nondiagnostic  FNA (03/21/2020):  Right inferior 2.5 cm nodule: Benign  Left mid 2.7 cm nodule: Benign  Pt denies: - feeling nodules in neck - hoarseness - choking - SOB with lying down But she has mild dysphagia with dry foods  Thyrotoxicosis:  Reviewed her TFTs: Lab Results  Component Value Date   TSH 0.07 (L) 08/01/2020   TSH 0.03 (L) 04/04/2020   TSH 0.07 (L) 02/01/2018   FREET4 0.70 08/01/2020   FREET4 0.84 04/04/2020   T3FREE 4.3 (H) 08/01/2020   T3FREE 3.5 04/04/2020  01/25/2020: TSH 0.033 (0.45-4.5)  Her TSI antibodies were not elevated: Lab Results  Component Value Date   TSI <89 04/04/2020   A thyroid uptake and scan (05/17/2020): Toxic nodules bilaterally with elevated 24-hour uptake: Hot nodules are identified within the mid LEFT lobe and at the superior and inferior poles of the RIGHT thyroid lobe, largest RIGHT inferior.  Suppression of uptake in remaining thyroid gland. 4 hour I-123 uptake = 10.1% (normal 5-20%) 24 hour I-123 uptake = 35.4% (normal 10-30%)  Since she was not very symptomatic and she had only mildly increased uptake, we decided to follow her clinically at that time..  In the past, she had intentional weight loss (limiting carbs-lost 38 pounds in the year prior to our last visit).  She gained 4 pounds before last visit after she relaxed her diet.  Pt does not have a FH of thyroid ds. No FH of thyroid cancer. No h/o radiation tx to head or neck.  No seaweed or kelp.  No recent contrast studies. No herbal supplements.  Previously on biotin, now off. No recent steroids use.   Pt. also has a history of nephrolithiasis  - s/p sx, HL, HTN, GERD.  She had cervical spine surgery.  ROS: Constitutional: no weight gain/no weight loss, no fatigue, no subjective hyperthermia, no subjective hypothermia Eyes: no blurry vision, no xerophthalmia ENT: no sore throat, + see  HPI Cardiovascular: no CP/no SOB/no palpitations/no leg swelling Respiratory: no cough/no SOB/no wheezing Gastrointestinal: no N/no V/no D/no C/+ acid reflux Musculoskeletal: no muscle aches/no joint aches Skin: no rashes, no hair loss Neurological: no tremors/no numbness/no tingling/no dizziness  I reviewed pt's medications, allergies, PMH, social hx, family hx, and changes were documented in the history of present illness. Otherwise, unchanged from my initial visit note.  Past Medical History:  Diagnosis Date  . Anal lesion   . Arthritis   . Back pain   . Bulging lumbar disc   . Cholelithiasis   . Cyst of spleen   . Gastric polyps   . GERD (gastroesophageal reflux disease)   . History of colon polyps 06/29/2018   2 9-12 mm polps transverse and ascending colon, 14 mm sigmoid colon  . History of kidney stones   . History of rectal bleeding   . Hypertension   . Hypertriglyceridemia   . Hypothyroidism   . IBS (irritable bowel syndrome)   . Incontinence of feces with fecal urgency   . Internal hemorrhoids    grade 2-3  . Migraine   . Neuromuscular disorder ([place])   . Peripheral neuropathy    due to antibiotic use  . Pinched nerve    L4-L5  . Pneumonia 01/2017  . Spinal stenosis   . Thyroid goiter   . Wears glasses    Past Surgical History:  Procedure Laterality Date  . APPENDECTOMY  2018  . CERVICAL FUSION  2016  . COLONOSCOPY W/ POLYPECTOMY  06/29/2018  . ESOPHAGOGASTRODUODENOSCOPY  02/10/2013   in [place], [place], Dr. Gerri Spore  . HEMORRHOID SURGERY N/A 08/03/2018   Procedure: HEMORRHOIDAL PEXY;  Surgeon: Leighton Ruff, MD;  Location: [facility];  Service: General;  Laterality: N/A;  . KIDNEY STONE SURGERY     x 2  . LITHOTRIPSY  2008   2-3 litho  . NEPHROSTOMY    . PARTIAL HYSTERECTOMY  2000  . RECTAL EXAM UNDER ANESTHESIA N/A 08/03/2018   Procedure: ANAL  EXAM UNDER ANESTHESIA;  Surgeon: Leighton Ruff, MD;  Location: [facility];  Service: General;  Laterality: N/A;   Social History   Socioeconomic History  . Marital status: Married    Spouse name: Not on file  . Number of children: 3  . Years of education: Not on file  . Highest education level: Not on file  Occupational History  . Occupation: CNA    Comment: [employer]  Tobacco Use  . Smoking status: Former Smoker    Packs/day: 1.00    Years: 15.00    Pack years: 15.00    Types: Cigarettes    Quit date: 2005    Years since quitting: 17.0  . Smokeless tobacco: Never Used  Vaping Use  . Vaping Use: Never used  Substance and Sexual Activity  . Alcohol use: Yes    Comment: rare  . Drug use: No  . Sexual activity: Yes    Birth control/protection: Surgical  Other Topics Concern  . Not on file  Social History Narrative  . Not on file  Social Determinants of Health   Financial Resource Strain: Not on file  Food Insecurity: Not on file  Transportation Needs: Not on file  Physical Activity: Not on file  Stress: Not on file  Social Connections: Not on file  Intimate Partner Violence: Not on file   Current Outpatient Medications on File Prior to Visit  Medication Sig Dispense Refill  . amLODipine-benazepril (LOTREL) 10-40 MG capsule Take 1 capsule by mouth daily.  1  . aspirin 81 MG chewable tablet Chew by mouth daily.    . Cholecalciferol (VITAMIN D PO) Take by mouth.    . colesevelam (WELCHOL) 625 MG tablet TAKE 1 TABLET BY MOUTH TWICE DAILY WITH MEALS 30 tablet 0  . Cranberry 1000 MG CAPS Take by mouth.    . Cyanocobalamin (VITAMIN B 12 PO) Take by mouth.    . diclofenac sodium (VOLTAREN) 1 % GEL Apply 1 application topically 2 (two) times daily as needed.  0  . famotidine (PEPCID) 20 MG tablet Take 20 mg by mouth 2 (two) times daily.    Javier Docker Oil (OMEGA-3) 500 MG CAPS Take by mouth.    . Multiple Vitamin (MULTIVITAMIN) capsule Take 1 capsule by mouth daily.    . nortriptyline (PAMELOR) 10 MG capsule TAKE 1-2 CAPSULES AT  BEDTIME  1  . oxyCODONE-acetaminophen (PERCOCET/ROXICET) 5-325 MG tablet at bedtime.   0  . pregabalin (LYRICA) 150 MG capsule Take 150 mg by mouth in the morning, at noon, and at bedtime.      No current facility-administered medications on file prior to visit.   Allergies  Allergen Reactions  . Sulfa Antibiotics Anaphylaxis  . Morphine And Related Itching  . Voltaren [Diclofenac] Hives   Family History  Problem Relation Age of Onset  . Diabetes Mother   . Hypertension Mother   . Breast cancer Mother   . Colon polyps Mother   . Heart disease Mother   . Prostate cancer Father   . Hypertension Maternal Grandmother   . Breast cancer Maternal Grandmother        ? don't actually know but she had a breast removed  . Heart disease Maternal Grandmother   . Other Sister        Breast tumors  . Colon cancer Neg Hx   . Esophageal cancer Neg Hx   . Stomach cancer Neg Hx   . Rectal cancer Neg Hx     PE: There were no vitals taken for this visit. Wt Readings from Last 3 Encounters:  08/01/20 187 lb (84.8 kg)  03/28/20 183 lb (83 kg)  08/03/18 214 lb 12.8 oz (97.4 kg)   Constitutional: overweight, in NAD Eyes: PERRLA, EOMI, no exophthalmos ENT: moist mucous membranes, no thyromegaly, no cervical lymphadenopathy Cardiovascular: RRR, No RG, +1/6 SEM Respiratory: CTA B Gastrointestinal: abdomen soft, NT, ND, BS+ Musculoskeletal: no deformities, strength intact in all 4 Skin: moist, warm, no rashes Neurological: no tremor with outstretched hands, DTR normal in all 4  ASSESSMENT: 1. Multiple thyroid nodules  2.   Thyrotoxicosis  PLAN:  1.  Thyroid nodules -Patient has a history of an enlarged thyroid since approximately 2016, when this was palpated by PCP.  A thyroid ultrasound was checked in 07/2019 and this showed 3 large nodules.  2 of them were biopsied in 08/2019 by previous endocrinologist, in the office.    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  The  left thyroid nodule biopsy was inconclusive due to scant material.  She was referred to ENT at that time and was advised that she needed total thyroidectomy.   She saw Dr. Harlow Asa for a second opinion and he ordered 2 new biopsies:  The right inferior nodule biopsy was benign  Repeat biopsy of the left thyroid nodule was also benign. Dr. Harlow Asa advised her that there was no clear indication for surgery.  I saw her soon afterwards and I concurred with Dr. Harlow Asa thyroidectomy was not mandatory at that time, especially since she did not have neck compression symptoms. -She is now due for another ultrasound.  I did order this at last visit but she did not have it done yet. -At today's visit, she has some dysphagia with dry foods, but otherwise no complaints  2.  Thyrotoxicosis -Patient has a low TSH since at least 2019, without clear thyrotoxic symptoms.  In the past she had weight loss but this was intentional, on a low-carb diet. -We checked a thyroid uptake and scan in 04/2020.  This showed thyroid nodules bilaterally and a mildly increased 24-hour iodine uptake.  In the absence of symptoms, we decided to just follow her clinically and biochemically. -We did discuss at that time and again now the definitive treatment for the condition is actually RAI treatment or surgery.  Methimazole can be used but this should be long-term, so usually the previous 2 treatments are preferred.  I explained that RAI treatment may also shrink the thyroid gland and possibly relieve her dysphagia, if this is related to thyroid compression.  She was open to RAI treatment, if needed. -At last visit, we rechecked her TFTs and they were improved, so we did not proceed with RAI treatment. -No tachycardia or tremors, so beta-blockers are not mandatory for now. -We will repeat her TFTs today.  If worse, we may need to proceed  with RAI treatment.  No orders of the defined types were placed in this encounter.   Philemon Kingdom, MD PhD [facility] Endocrinology

## 2020-12-06 DIAGNOSIS — G629 Polyneuropathy, unspecified: Secondary | ICD-10-CM | POA: Diagnosis not present

## 2020-12-06 DIAGNOSIS — I83813 Varicose veins of bilateral lower extremities with pain: Secondary | ICD-10-CM | POA: Diagnosis not present

## 2020-12-06 DIAGNOSIS — Z981 Arthrodesis status: Secondary | ICD-10-CM | POA: Diagnosis not present

## 2020-12-06 DIAGNOSIS — M5136 Other intervertebral disc degeneration, lumbar region: Secondary | ICD-10-CM | POA: Diagnosis not present

## 2020-12-06 DIAGNOSIS — E669 Obesity, unspecified: Secondary | ICD-10-CM | POA: Diagnosis not present

## 2020-12-06 DIAGNOSIS — M47816 Spondylosis without myelopathy or radiculopathy, lumbar region: Secondary | ICD-10-CM | POA: Diagnosis not present

## 2020-12-06 DIAGNOSIS — Z79891 Long term (current) use of opiate analgesic: Secondary | ICD-10-CM | POA: Diagnosis not present

## 2020-12-18 DIAGNOSIS — G43909 Migraine, unspecified, not intractable, without status migrainosus: Secondary | ICD-10-CM | POA: Diagnosis not present

## 2020-12-18 DIAGNOSIS — R69 Illness, unspecified: Secondary | ICD-10-CM | POA: Diagnosis not present

## 2020-12-18 DIAGNOSIS — Z6833 Body mass index (BMI) 33.0-33.9, adult: Secondary | ICD-10-CM | POA: Diagnosis not present

## 2020-12-20 DIAGNOSIS — Z1152 Encounter for screening for COVID-19: Secondary | ICD-10-CM | POA: Diagnosis not present

## 2020-12-27 DIAGNOSIS — Z1339 Encounter for screening examination for other mental health and behavioral disorders: Secondary | ICD-10-CM | POA: Diagnosis not present

## 2020-12-27 DIAGNOSIS — Z1152 Encounter for screening for COVID-19: Secondary | ICD-10-CM | POA: Diagnosis not present

## 2020-12-27 DIAGNOSIS — Z0000 Encounter for general adult medical examination without abnormal findings: Secondary | ICD-10-CM | POA: Diagnosis not present

## 2020-12-27 DIAGNOSIS — E781 Pure hyperglyceridemia: Secondary | ICD-10-CM | POA: Diagnosis not present

## 2020-12-27 DIAGNOSIS — E0590 Thyrotoxicosis, unspecified without thyrotoxic crisis or storm: Secondary | ICD-10-CM | POA: Diagnosis not present

## 2021-01-02 DIAGNOSIS — R69 Illness, unspecified: Secondary | ICD-10-CM | POA: Diagnosis not present

## 2021-01-02 DIAGNOSIS — Z1152 Encounter for screening for COVID-19: Secondary | ICD-10-CM | POA: Diagnosis not present

## 2021-01-03 DIAGNOSIS — G629 Polyneuropathy, unspecified: Secondary | ICD-10-CM | POA: Diagnosis not present

## 2021-01-03 DIAGNOSIS — M5136 Other intervertebral disc degeneration, lumbar region: Secondary | ICD-10-CM | POA: Diagnosis not present

## 2021-01-03 DIAGNOSIS — Z981 Arthrodesis status: Secondary | ICD-10-CM | POA: Diagnosis not present

## 2021-01-03 DIAGNOSIS — M47816 Spondylosis without myelopathy or radiculopathy, lumbar region: Secondary | ICD-10-CM | POA: Diagnosis not present

## 2021-01-03 DIAGNOSIS — Z79891 Long term (current) use of opiate analgesic: Secondary | ICD-10-CM | POA: Diagnosis not present

## 2021-01-03 DIAGNOSIS — E669 Obesity, unspecified: Secondary | ICD-10-CM | POA: Diagnosis not present

## 2021-01-03 DIAGNOSIS — I83813 Varicose veins of bilateral lower extremities with pain: Secondary | ICD-10-CM | POA: Diagnosis not present

## 2021-01-14 DIAGNOSIS — M545 Low back pain, unspecified: Secondary | ICD-10-CM | POA: Diagnosis not present

## 2021-01-14 DIAGNOSIS — M47816 Spondylosis without myelopathy or radiculopathy, lumbar region: Secondary | ICD-10-CM | POA: Diagnosis not present

## 2021-01-31 DIAGNOSIS — M47816 Spondylosis without myelopathy or radiculopathy, lumbar region: Secondary | ICD-10-CM | POA: Diagnosis not present

## 2021-01-31 DIAGNOSIS — I83813 Varicose veins of bilateral lower extremities with pain: Secondary | ICD-10-CM | POA: Diagnosis not present

## 2021-01-31 DIAGNOSIS — E669 Obesity, unspecified: Secondary | ICD-10-CM | POA: Diagnosis not present

## 2021-01-31 DIAGNOSIS — M5136 Other intervertebral disc degeneration, lumbar region: Secondary | ICD-10-CM | POA: Diagnosis not present

## 2021-01-31 DIAGNOSIS — Z79891 Long term (current) use of opiate analgesic: Secondary | ICD-10-CM | POA: Diagnosis not present

## 2021-01-31 DIAGNOSIS — Z981 Arthrodesis status: Secondary | ICD-10-CM | POA: Diagnosis not present

## 2021-01-31 DIAGNOSIS — G629 Polyneuropathy, unspecified: Secondary | ICD-10-CM | POA: Diagnosis not present

## 2021-02-13 DIAGNOSIS — E781 Pure hyperglyceridemia: Secondary | ICD-10-CM | POA: Diagnosis not present

## 2021-02-13 DIAGNOSIS — I10 Essential (primary) hypertension: Secondary | ICD-10-CM | POA: Diagnosis not present

## 2021-02-13 DIAGNOSIS — E041 Nontoxic single thyroid nodule: Secondary | ICD-10-CM | POA: Diagnosis not present

## 2021-02-13 DIAGNOSIS — E0590 Thyrotoxicosis, unspecified without thyrotoxic crisis or storm: Secondary | ICD-10-CM | POA: Diagnosis not present

## 2021-03-07 DIAGNOSIS — Z981 Arthrodesis status: Secondary | ICD-10-CM | POA: Diagnosis not present

## 2021-03-07 DIAGNOSIS — Z79891 Long term (current) use of opiate analgesic: Secondary | ICD-10-CM | POA: Diagnosis not present

## 2021-03-07 DIAGNOSIS — I83813 Varicose veins of bilateral lower extremities with pain: Secondary | ICD-10-CM | POA: Diagnosis not present

## 2021-03-07 DIAGNOSIS — G629 Polyneuropathy, unspecified: Secondary | ICD-10-CM | POA: Diagnosis not present

## 2021-03-07 DIAGNOSIS — E669 Obesity, unspecified: Secondary | ICD-10-CM | POA: Diagnosis not present

## 2021-03-07 DIAGNOSIS — M47816 Spondylosis without myelopathy or radiculopathy, lumbar region: Secondary | ICD-10-CM | POA: Diagnosis not present

## 2021-03-07 DIAGNOSIS — M5136 Other intervertebral disc degeneration, lumbar region: Secondary | ICD-10-CM | POA: Diagnosis not present

## 2021-03-20 DIAGNOSIS — G894 Chronic pain syndrome: Secondary | ICD-10-CM | POA: Diagnosis not present

## 2021-03-20 DIAGNOSIS — M5416 Radiculopathy, lumbar region: Secondary | ICD-10-CM | POA: Diagnosis not present

## 2021-04-04 DIAGNOSIS — M5136 Other intervertebral disc degeneration, lumbar region: Secondary | ICD-10-CM | POA: Diagnosis not present

## 2021-04-04 DIAGNOSIS — Z981 Arthrodesis status: Secondary | ICD-10-CM | POA: Diagnosis not present

## 2021-04-04 DIAGNOSIS — E669 Obesity, unspecified: Secondary | ICD-10-CM | POA: Diagnosis not present

## 2021-04-04 DIAGNOSIS — G629 Polyneuropathy, unspecified: Secondary | ICD-10-CM | POA: Diagnosis not present

## 2021-04-04 DIAGNOSIS — Z79891 Long term (current) use of opiate analgesic: Secondary | ICD-10-CM | POA: Diagnosis not present

## 2021-04-04 DIAGNOSIS — M47816 Spondylosis without myelopathy or radiculopathy, lumbar region: Secondary | ICD-10-CM | POA: Diagnosis not present

## 2021-04-04 DIAGNOSIS — I83813 Varicose veins of bilateral lower extremities with pain: Secondary | ICD-10-CM | POA: Diagnosis not present

## 2021-05-02 DIAGNOSIS — G629 Polyneuropathy, unspecified: Secondary | ICD-10-CM | POA: Diagnosis not present

## 2021-05-02 DIAGNOSIS — I83813 Varicose veins of bilateral lower extremities with pain: Secondary | ICD-10-CM | POA: Diagnosis not present

## 2021-05-02 DIAGNOSIS — G894 Chronic pain syndrome: Secondary | ICD-10-CM | POA: Diagnosis not present

## 2021-05-02 DIAGNOSIS — Z79891 Long term (current) use of opiate analgesic: Secondary | ICD-10-CM | POA: Diagnosis not present

## 2021-05-02 DIAGNOSIS — E669 Obesity, unspecified: Secondary | ICD-10-CM | POA: Diagnosis not present

## 2021-05-02 DIAGNOSIS — M47816 Spondylosis without myelopathy or radiculopathy, lumbar region: Secondary | ICD-10-CM | POA: Diagnosis not present

## 2021-05-02 DIAGNOSIS — M5136 Other intervertebral disc degeneration, lumbar region: Secondary | ICD-10-CM | POA: Diagnosis not present

## 2021-05-02 DIAGNOSIS — Z981 Arthrodesis status: Secondary | ICD-10-CM | POA: Diagnosis not present

## 2021-05-30 DIAGNOSIS — I83813 Varicose veins of bilateral lower extremities with pain: Secondary | ICD-10-CM | POA: Diagnosis not present

## 2021-05-30 DIAGNOSIS — Z981 Arthrodesis status: Secondary | ICD-10-CM | POA: Diagnosis not present

## 2021-05-30 DIAGNOSIS — Z1389 Encounter for screening for other disorder: Secondary | ICD-10-CM | POA: Diagnosis not present

## 2021-05-30 DIAGNOSIS — M47816 Spondylosis without myelopathy or radiculopathy, lumbar region: Secondary | ICD-10-CM | POA: Diagnosis not present

## 2021-05-30 DIAGNOSIS — M5136 Other intervertebral disc degeneration, lumbar region: Secondary | ICD-10-CM | POA: Diagnosis not present

## 2021-05-30 DIAGNOSIS — E669 Obesity, unspecified: Secondary | ICD-10-CM | POA: Diagnosis not present

## 2021-05-30 DIAGNOSIS — Z79891 Long term (current) use of opiate analgesic: Secondary | ICD-10-CM | POA: Diagnosis not present

## 2021-05-30 DIAGNOSIS — G629 Polyneuropathy, unspecified: Secondary | ICD-10-CM | POA: Diagnosis not present

## 2021-06-27 DIAGNOSIS — Z981 Arthrodesis status: Secondary | ICD-10-CM | POA: Diagnosis not present

## 2021-06-27 DIAGNOSIS — M5136 Other intervertebral disc degeneration, lumbar region: Secondary | ICD-10-CM | POA: Diagnosis not present

## 2021-06-27 DIAGNOSIS — Z79891 Long term (current) use of opiate analgesic: Secondary | ICD-10-CM | POA: Diagnosis not present

## 2021-06-27 DIAGNOSIS — M47816 Spondylosis without myelopathy or radiculopathy, lumbar region: Secondary | ICD-10-CM | POA: Diagnosis not present

## 2021-06-27 DIAGNOSIS — E669 Obesity, unspecified: Secondary | ICD-10-CM | POA: Diagnosis not present

## 2021-06-27 DIAGNOSIS — Z1389 Encounter for screening for other disorder: Secondary | ICD-10-CM | POA: Diagnosis not present

## 2021-06-27 DIAGNOSIS — I83813 Varicose veins of bilateral lower extremities with pain: Secondary | ICD-10-CM | POA: Diagnosis not present

## 2021-06-27 DIAGNOSIS — G629 Polyneuropathy, unspecified: Secondary | ICD-10-CM | POA: Diagnosis not present

## 2021-07-25 DIAGNOSIS — Z981 Arthrodesis status: Secondary | ICD-10-CM | POA: Diagnosis not present

## 2021-07-25 DIAGNOSIS — M5136 Other intervertebral disc degeneration, lumbar region: Secondary | ICD-10-CM | POA: Diagnosis not present

## 2021-07-25 DIAGNOSIS — Z79891 Long term (current) use of opiate analgesic: Secondary | ICD-10-CM | POA: Diagnosis not present

## 2021-07-25 DIAGNOSIS — I83813 Varicose veins of bilateral lower extremities with pain: Secondary | ICD-10-CM | POA: Diagnosis not present

## 2021-07-25 DIAGNOSIS — Z1389 Encounter for screening for other disorder: Secondary | ICD-10-CM | POA: Diagnosis not present

## 2021-07-25 DIAGNOSIS — G629 Polyneuropathy, unspecified: Secondary | ICD-10-CM | POA: Diagnosis not present

## 2021-07-25 DIAGNOSIS — M47816 Spondylosis without myelopathy or radiculopathy, lumbar region: Secondary | ICD-10-CM | POA: Diagnosis not present

## 2021-07-25 DIAGNOSIS — E669 Obesity, unspecified: Secondary | ICD-10-CM | POA: Diagnosis not present

## 2021-08-07 DIAGNOSIS — E781 Pure hyperglyceridemia: Secondary | ICD-10-CM | POA: Diagnosis not present

## 2021-08-07 DIAGNOSIS — E0590 Thyrotoxicosis, unspecified without thyrotoxic crisis or storm: Secondary | ICD-10-CM | POA: Diagnosis not present

## 2021-08-07 DIAGNOSIS — R7301 Impaired fasting glucose: Secondary | ICD-10-CM | POA: Diagnosis not present

## 2021-08-15 DIAGNOSIS — E041 Nontoxic single thyroid nodule: Secondary | ICD-10-CM | POA: Diagnosis not present

## 2021-08-15 DIAGNOSIS — E781 Pure hyperglyceridemia: Secondary | ICD-10-CM | POA: Diagnosis not present

## 2021-08-15 DIAGNOSIS — I10 Essential (primary) hypertension: Secondary | ICD-10-CM | POA: Diagnosis not present

## 2021-08-22 DIAGNOSIS — M47816 Spondylosis without myelopathy or radiculopathy, lumbar region: Secondary | ICD-10-CM | POA: Diagnosis not present

## 2021-08-22 DIAGNOSIS — E669 Obesity, unspecified: Secondary | ICD-10-CM | POA: Diagnosis not present

## 2021-08-22 DIAGNOSIS — Z79891 Long term (current) use of opiate analgesic: Secondary | ICD-10-CM | POA: Diagnosis not present

## 2021-08-22 DIAGNOSIS — G629 Polyneuropathy, unspecified: Secondary | ICD-10-CM | POA: Diagnosis not present

## 2021-08-22 DIAGNOSIS — I83813 Varicose veins of bilateral lower extremities with pain: Secondary | ICD-10-CM | POA: Diagnosis not present

## 2021-08-22 DIAGNOSIS — Z1389 Encounter for screening for other disorder: Secondary | ICD-10-CM | POA: Diagnosis not present

## 2021-08-22 DIAGNOSIS — Z981 Arthrodesis status: Secondary | ICD-10-CM | POA: Diagnosis not present

## 2021-08-22 DIAGNOSIS — M5136 Other intervertebral disc degeneration, lumbar region: Secondary | ICD-10-CM | POA: Diagnosis not present

## 2021-09-06 DIAGNOSIS — M549 Dorsalgia, unspecified: Secondary | ICD-10-CM | POA: Diagnosis not present

## 2021-09-06 DIAGNOSIS — S0990XA Unspecified injury of head, initial encounter: Secondary | ICD-10-CM | POA: Diagnosis not present

## 2021-09-06 DIAGNOSIS — S199XXA Unspecified injury of neck, initial encounter: Secondary | ICD-10-CM | POA: Diagnosis not present

## 2021-09-06 DIAGNOSIS — R0902 Hypoxemia: Secondary | ICD-10-CM | POA: Diagnosis not present

## 2021-09-06 DIAGNOSIS — R519 Headache, unspecified: Secondary | ICD-10-CM | POA: Diagnosis not present

## 2021-09-06 DIAGNOSIS — Z981 Arthrodesis status: Secondary | ICD-10-CM | POA: Diagnosis not present

## 2021-09-06 DIAGNOSIS — W19XXXA Unspecified fall, initial encounter: Secondary | ICD-10-CM | POA: Diagnosis not present

## 2021-09-06 DIAGNOSIS — M25511 Pain in right shoulder: Secondary | ICD-10-CM | POA: Diagnosis not present

## 2021-09-06 DIAGNOSIS — G8929 Other chronic pain: Secondary | ICD-10-CM | POA: Diagnosis not present

## 2021-09-06 DIAGNOSIS — Z743 Need for continuous supervision: Secondary | ICD-10-CM | POA: Diagnosis not present

## 2021-09-19 DIAGNOSIS — Z136 Encounter for screening for cardiovascular disorders: Secondary | ICD-10-CM | POA: Diagnosis not present

## 2021-09-19 DIAGNOSIS — W19XXXA Unspecified fall, initial encounter: Secondary | ICD-10-CM | POA: Diagnosis not present

## 2021-09-19 DIAGNOSIS — R42 Dizziness and giddiness: Secondary | ICD-10-CM | POA: Diagnosis not present

## 2021-09-19 DIAGNOSIS — Z139 Encounter for screening, unspecified: Secondary | ICD-10-CM | POA: Diagnosis not present

## 2021-09-19 DIAGNOSIS — Z0000 Encounter for general adult medical examination without abnormal findings: Secondary | ICD-10-CM | POA: Diagnosis not present

## 2021-09-19 DIAGNOSIS — M25511 Pain in right shoulder: Secondary | ICD-10-CM | POA: Diagnosis not present

## 2021-09-19 DIAGNOSIS — S0093XA Contusion of unspecified part of head, initial encounter: Secondary | ICD-10-CM | POA: Diagnosis not present

## 2021-09-19 DIAGNOSIS — Z1331 Encounter for screening for depression: Secondary | ICD-10-CM | POA: Diagnosis not present

## 2021-09-19 DIAGNOSIS — G8929 Other chronic pain: Secondary | ICD-10-CM | POA: Diagnosis not present

## 2021-09-25 DIAGNOSIS — Z1389 Encounter for screening for other disorder: Secondary | ICD-10-CM | POA: Diagnosis not present

## 2021-09-25 DIAGNOSIS — E669 Obesity, unspecified: Secondary | ICD-10-CM | POA: Diagnosis not present

## 2021-09-25 DIAGNOSIS — Z981 Arthrodesis status: Secondary | ICD-10-CM | POA: Diagnosis not present

## 2021-09-25 DIAGNOSIS — I83813 Varicose veins of bilateral lower extremities with pain: Secondary | ICD-10-CM | POA: Diagnosis not present

## 2021-09-25 DIAGNOSIS — G629 Polyneuropathy, unspecified: Secondary | ICD-10-CM | POA: Diagnosis not present

## 2021-09-25 DIAGNOSIS — M47816 Spondylosis without myelopathy or radiculopathy, lumbar region: Secondary | ICD-10-CM | POA: Diagnosis not present

## 2021-09-25 DIAGNOSIS — M5136 Other intervertebral disc degeneration, lumbar region: Secondary | ICD-10-CM | POA: Diagnosis not present

## 2021-09-25 DIAGNOSIS — Z79891 Long term (current) use of opiate analgesic: Secondary | ICD-10-CM | POA: Diagnosis not present

## 2021-09-30 DIAGNOSIS — M2569 Stiffness of other specified joint, not elsewhere classified: Secondary | ICD-10-CM | POA: Diagnosis not present

## 2021-09-30 DIAGNOSIS — M542 Cervicalgia: Secondary | ICD-10-CM | POA: Diagnosis not present

## 2021-09-30 DIAGNOSIS — R42 Dizziness and giddiness: Secondary | ICD-10-CM | POA: Diagnosis not present

## 2021-10-08 DIAGNOSIS — M2569 Stiffness of other specified joint, not elsewhere classified: Secondary | ICD-10-CM | POA: Diagnosis not present

## 2021-10-08 DIAGNOSIS — M542 Cervicalgia: Secondary | ICD-10-CM | POA: Diagnosis not present

## 2021-10-08 DIAGNOSIS — R42 Dizziness and giddiness: Secondary | ICD-10-CM | POA: Diagnosis not present

## 2021-10-10 DIAGNOSIS — R42 Dizziness and giddiness: Secondary | ICD-10-CM | POA: Diagnosis not present

## 2021-10-10 DIAGNOSIS — M2569 Stiffness of other specified joint, not elsewhere classified: Secondary | ICD-10-CM | POA: Diagnosis not present

## 2021-10-10 DIAGNOSIS — M542 Cervicalgia: Secondary | ICD-10-CM | POA: Diagnosis not present

## 2021-10-16 DIAGNOSIS — Z1231 Encounter for screening mammogram for malignant neoplasm of breast: Secondary | ICD-10-CM | POA: Diagnosis not present

## 2021-10-16 DIAGNOSIS — E042 Nontoxic multinodular goiter: Secondary | ICD-10-CM | POA: Diagnosis not present

## 2021-10-23 DIAGNOSIS — E669 Obesity, unspecified: Secondary | ICD-10-CM | POA: Diagnosis not present

## 2021-10-23 DIAGNOSIS — Z79891 Long term (current) use of opiate analgesic: Secondary | ICD-10-CM | POA: Diagnosis not present

## 2021-10-23 DIAGNOSIS — M5136 Other intervertebral disc degeneration, lumbar region: Secondary | ICD-10-CM | POA: Diagnosis not present

## 2021-10-23 DIAGNOSIS — Z981 Arthrodesis status: Secondary | ICD-10-CM | POA: Diagnosis not present

## 2021-10-23 DIAGNOSIS — Z1389 Encounter for screening for other disorder: Secondary | ICD-10-CM | POA: Diagnosis not present

## 2021-10-23 DIAGNOSIS — M47816 Spondylosis without myelopathy or radiculopathy, lumbar region: Secondary | ICD-10-CM | POA: Diagnosis not present

## 2021-10-23 DIAGNOSIS — G629 Polyneuropathy, unspecified: Secondary | ICD-10-CM | POA: Diagnosis not present

## 2021-10-23 DIAGNOSIS — I83813 Varicose veins of bilateral lower extremities with pain: Secondary | ICD-10-CM | POA: Diagnosis not present

## 2021-11-27 DIAGNOSIS — I83813 Varicose veins of bilateral lower extremities with pain: Secondary | ICD-10-CM | POA: Diagnosis not present

## 2021-11-27 DIAGNOSIS — Z1389 Encounter for screening for other disorder: Secondary | ICD-10-CM | POA: Diagnosis not present

## 2021-11-27 DIAGNOSIS — Z981 Arthrodesis status: Secondary | ICD-10-CM | POA: Diagnosis not present

## 2021-11-27 DIAGNOSIS — Z79891 Long term (current) use of opiate analgesic: Secondary | ICD-10-CM | POA: Diagnosis not present

## 2021-11-27 DIAGNOSIS — M47816 Spondylosis without myelopathy or radiculopathy, lumbar region: Secondary | ICD-10-CM | POA: Diagnosis not present

## 2021-11-27 DIAGNOSIS — G894 Chronic pain syndrome: Secondary | ICD-10-CM | POA: Diagnosis not present

## 2021-11-27 DIAGNOSIS — E669 Obesity, unspecified: Secondary | ICD-10-CM | POA: Diagnosis not present

## 2021-11-27 DIAGNOSIS — G629 Polyneuropathy, unspecified: Secondary | ICD-10-CM | POA: Diagnosis not present

## 2021-11-27 DIAGNOSIS — M5136 Other intervertebral disc degeneration, lumbar region: Secondary | ICD-10-CM | POA: Diagnosis not present

## 2021-12-24 DIAGNOSIS — M5136 Other intervertebral disc degeneration, lumbar region: Secondary | ICD-10-CM | POA: Diagnosis not present

## 2021-12-24 DIAGNOSIS — Z1389 Encounter for screening for other disorder: Secondary | ICD-10-CM | POA: Diagnosis not present

## 2021-12-24 DIAGNOSIS — G894 Chronic pain syndrome: Secondary | ICD-10-CM | POA: Diagnosis not present

## 2021-12-24 DIAGNOSIS — Z79891 Long term (current) use of opiate analgesic: Secondary | ICD-10-CM | POA: Diagnosis not present

## 2021-12-24 DIAGNOSIS — Z981 Arthrodesis status: Secondary | ICD-10-CM | POA: Diagnosis not present

## 2021-12-24 DIAGNOSIS — I83813 Varicose veins of bilateral lower extremities with pain: Secondary | ICD-10-CM | POA: Diagnosis not present

## 2021-12-24 DIAGNOSIS — G629 Polyneuropathy, unspecified: Secondary | ICD-10-CM | POA: Diagnosis not present

## 2021-12-24 DIAGNOSIS — E669 Obesity, unspecified: Secondary | ICD-10-CM | POA: Diagnosis not present

## 2021-12-24 DIAGNOSIS — M47816 Spondylosis without myelopathy or radiculopathy, lumbar region: Secondary | ICD-10-CM | POA: Diagnosis not present

## 2022-01-21 DIAGNOSIS — I83813 Varicose veins of bilateral lower extremities with pain: Secondary | ICD-10-CM | POA: Diagnosis not present

## 2022-01-21 DIAGNOSIS — G629 Polyneuropathy, unspecified: Secondary | ICD-10-CM | POA: Diagnosis not present

## 2022-01-21 DIAGNOSIS — E669 Obesity, unspecified: Secondary | ICD-10-CM | POA: Diagnosis not present

## 2022-01-21 DIAGNOSIS — M5136 Other intervertebral disc degeneration, lumbar region: Secondary | ICD-10-CM | POA: Diagnosis not present

## 2022-01-21 DIAGNOSIS — Z981 Arthrodesis status: Secondary | ICD-10-CM | POA: Diagnosis not present

## 2022-01-21 DIAGNOSIS — Z79891 Long term (current) use of opiate analgesic: Secondary | ICD-10-CM | POA: Diagnosis not present

## 2022-01-21 DIAGNOSIS — M47816 Spondylosis without myelopathy or radiculopathy, lumbar region: Secondary | ICD-10-CM | POA: Diagnosis not present

## 2022-01-21 DIAGNOSIS — Z1389 Encounter for screening for other disorder: Secondary | ICD-10-CM | POA: Diagnosis not present

## 2022-01-30 DIAGNOSIS — G894 Chronic pain syndrome: Secondary | ICD-10-CM | POA: Diagnosis not present

## 2022-01-30 DIAGNOSIS — I10 Essential (primary) hypertension: Secondary | ICD-10-CM | POA: Diagnosis not present

## 2022-01-30 DIAGNOSIS — M5136 Other intervertebral disc degeneration, lumbar region: Secondary | ICD-10-CM | POA: Diagnosis not present

## 2022-02-18 DIAGNOSIS — I83813 Varicose veins of bilateral lower extremities with pain: Secondary | ICD-10-CM | POA: Diagnosis not present

## 2022-02-18 DIAGNOSIS — M47816 Spondylosis without myelopathy or radiculopathy, lumbar region: Secondary | ICD-10-CM | POA: Diagnosis not present

## 2022-02-18 DIAGNOSIS — Z79891 Long term (current) use of opiate analgesic: Secondary | ICD-10-CM | POA: Diagnosis not present

## 2022-02-18 DIAGNOSIS — G629 Polyneuropathy, unspecified: Secondary | ICD-10-CM | POA: Diagnosis not present

## 2022-02-18 DIAGNOSIS — M5136 Other intervertebral disc degeneration, lumbar region: Secondary | ICD-10-CM | POA: Diagnosis not present

## 2022-02-18 DIAGNOSIS — Z981 Arthrodesis status: Secondary | ICD-10-CM | POA: Diagnosis not present

## 2022-02-18 DIAGNOSIS — E669 Obesity, unspecified: Secondary | ICD-10-CM | POA: Diagnosis not present

## 2022-02-18 DIAGNOSIS — Z1389 Encounter for screening for other disorder: Secondary | ICD-10-CM | POA: Diagnosis not present

## 2022-02-20 DIAGNOSIS — E0590 Thyrotoxicosis, unspecified without thyrotoxic crisis or storm: Secondary | ICD-10-CM | POA: Diagnosis not present

## 2022-02-20 DIAGNOSIS — R7303 Prediabetes: Secondary | ICD-10-CM | POA: Diagnosis not present

## 2022-02-20 DIAGNOSIS — E781 Pure hyperglyceridemia: Secondary | ICD-10-CM | POA: Diagnosis not present

## 2022-02-20 DIAGNOSIS — I10 Essential (primary) hypertension: Secondary | ICD-10-CM | POA: Diagnosis not present

## 2022-02-25 DIAGNOSIS — E0590 Thyrotoxicosis, unspecified without thyrotoxic crisis or storm: Secondary | ICD-10-CM | POA: Diagnosis not present

## 2022-02-25 DIAGNOSIS — E785 Hyperlipidemia, unspecified: Secondary | ICD-10-CM | POA: Diagnosis not present

## 2022-02-25 DIAGNOSIS — I10 Essential (primary) hypertension: Secondary | ICD-10-CM | POA: Diagnosis not present

## 2022-02-25 DIAGNOSIS — R7303 Prediabetes: Secondary | ICD-10-CM | POA: Diagnosis not present

## 2022-03-04 DIAGNOSIS — Z23 Encounter for immunization: Secondary | ICD-10-CM | POA: Diagnosis not present

## 2022-03-18 DIAGNOSIS — I10 Essential (primary) hypertension: Secondary | ICD-10-CM | POA: Diagnosis not present

## 2022-03-18 DIAGNOSIS — Z1389 Encounter for screening for other disorder: Secondary | ICD-10-CM | POA: Diagnosis not present

## 2022-03-18 DIAGNOSIS — Z981 Arthrodesis status: Secondary | ICD-10-CM | POA: Diagnosis not present

## 2022-03-18 DIAGNOSIS — E669 Obesity, unspecified: Secondary | ICD-10-CM | POA: Diagnosis not present

## 2022-03-18 DIAGNOSIS — M5136 Other intervertebral disc degeneration, lumbar region: Secondary | ICD-10-CM | POA: Diagnosis not present

## 2022-03-18 DIAGNOSIS — G629 Polyneuropathy, unspecified: Secondary | ICD-10-CM | POA: Diagnosis not present

## 2022-03-18 DIAGNOSIS — M47816 Spondylosis without myelopathy or radiculopathy, lumbar region: Secondary | ICD-10-CM | POA: Diagnosis not present

## 2022-03-18 DIAGNOSIS — Z79891 Long term (current) use of opiate analgesic: Secondary | ICD-10-CM | POA: Diagnosis not present

## 2022-03-18 DIAGNOSIS — I83813 Varicose veins of bilateral lower extremities with pain: Secondary | ICD-10-CM | POA: Diagnosis not present

## 2022-03-31 IMAGING — US US FNA BIOPSY THYROID 1ST LESION
1 series · 13 of 25 positions shown · non-contrast
Comparison: US Thyroid 07/26/2019

INDICATION: Indeterminate thyroid nodule

Right inferior lobe thyroid nodule; 2.5 cm
Left mid lobe nodule; 2.7 cm (repeat)
EXAM:
ULTRASOUND GUIDED FINE NEEDLE ASPIRATION OF INDETERMINATE THYROID
NODULE
TECHNIQUE: Informed written consent was obtained from the patient after a
discussion of the risks, benefits and alternatives to treatment.
Questions regarding the procedure were encouraged and answered. A
timeout was performed prior to the initiation of the procedure.

[Series 1: us fna biopsy thyroid 1st lesion · 0.06mm/px · 33 acquisitions, 13 frames shown]
[im 1/33]
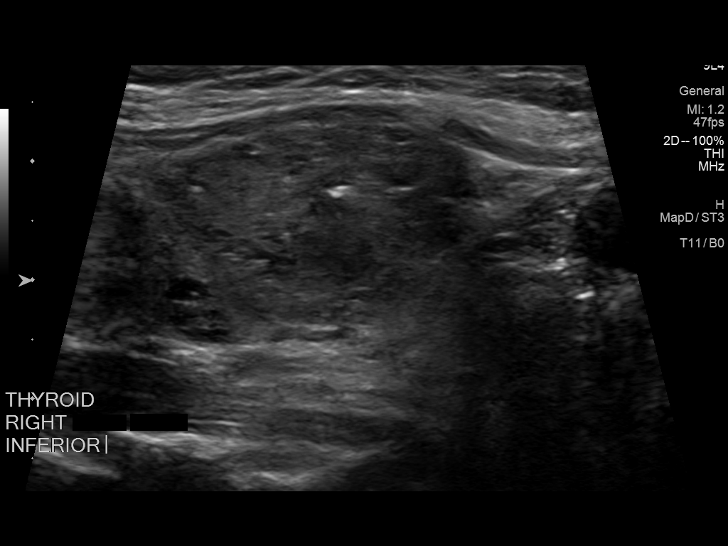
[im 3/33]
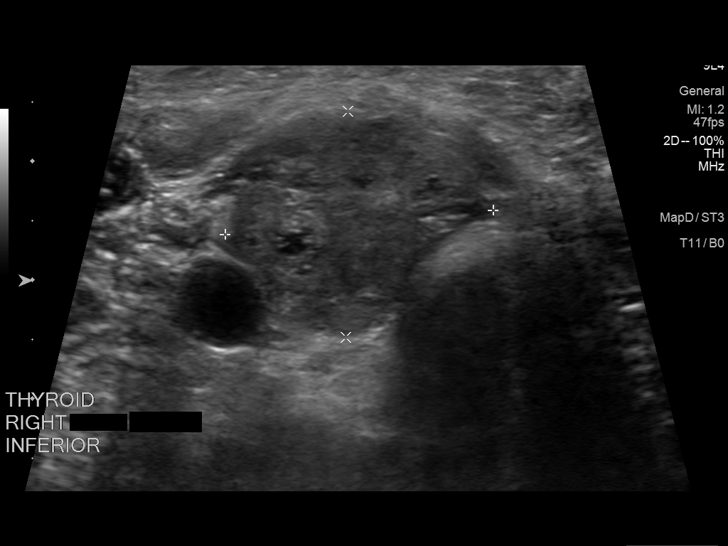
[im 6/33]
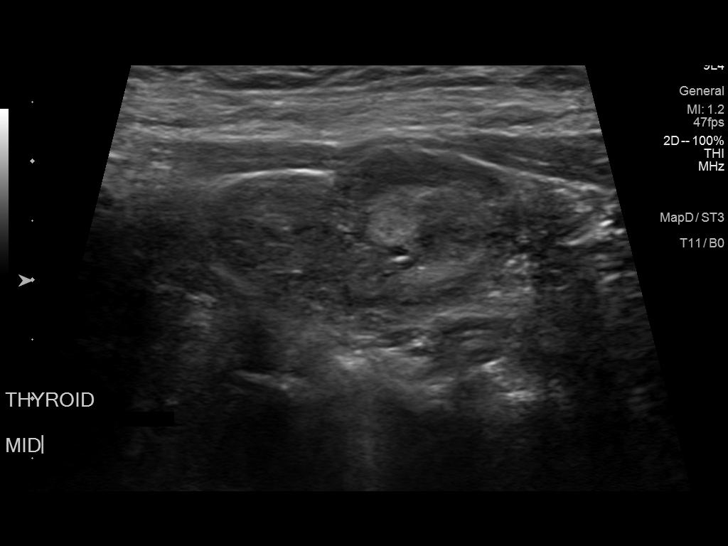
[im 9/33]
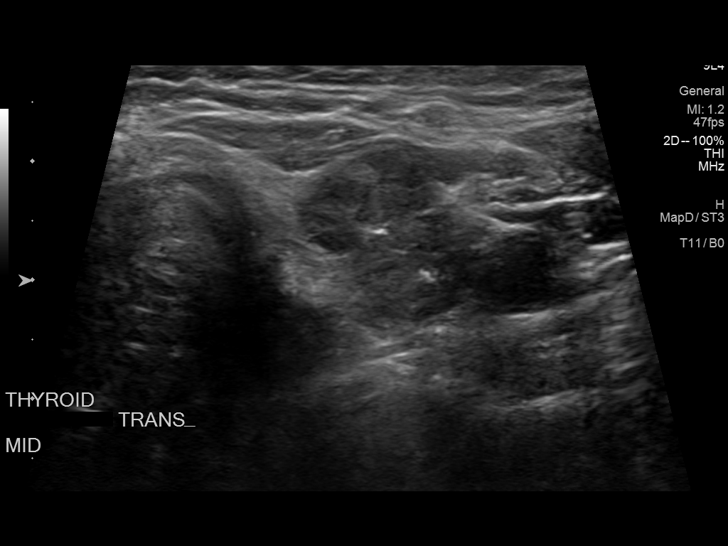
[im 11/33]
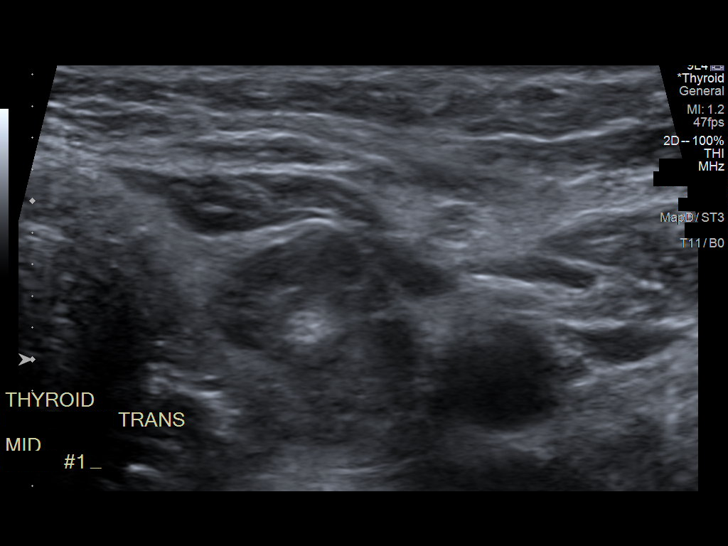
[im 14/33]
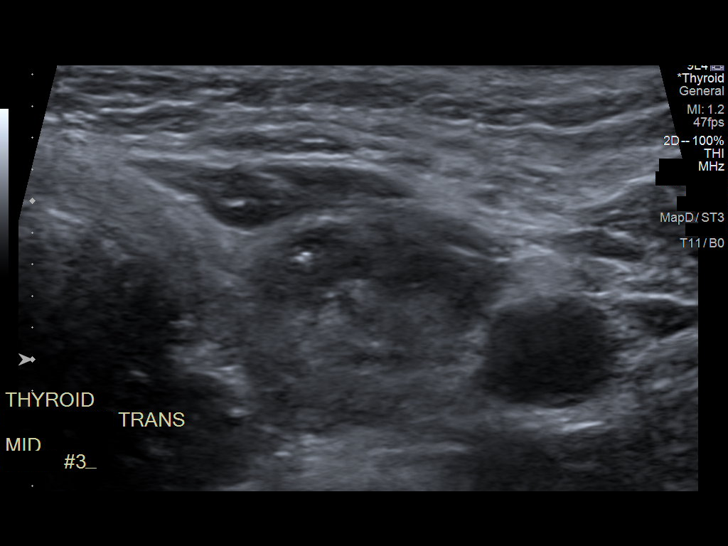
[im 17/33]
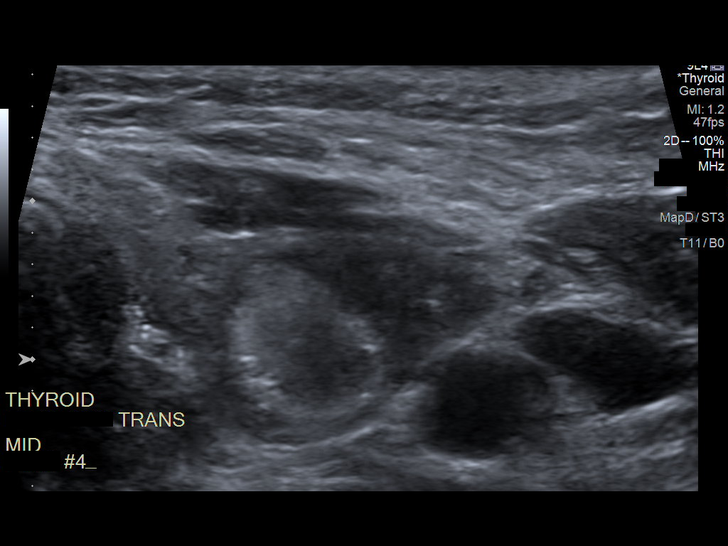
[im 19/33]
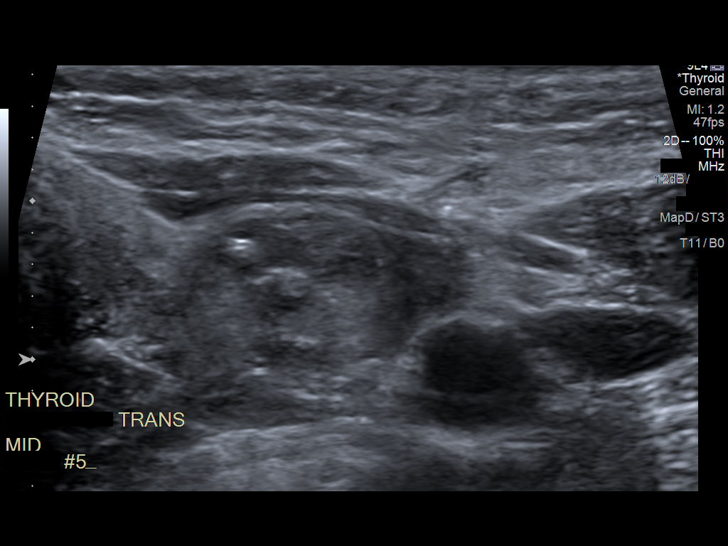
[im 22/33]
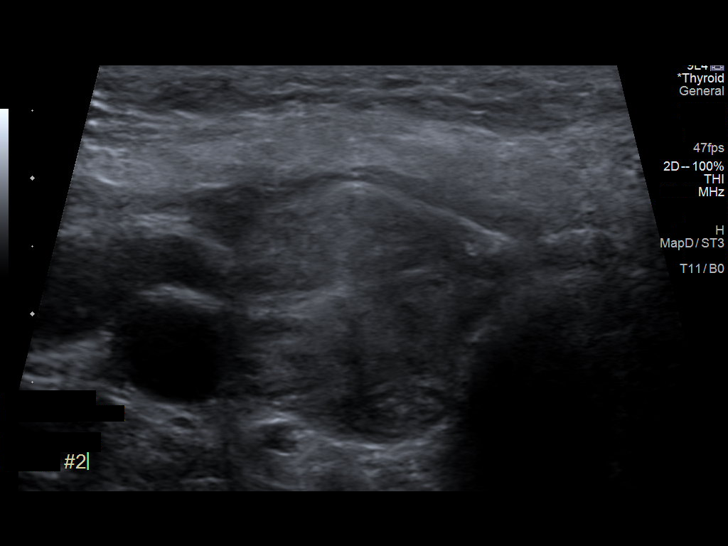
[im 25/33]
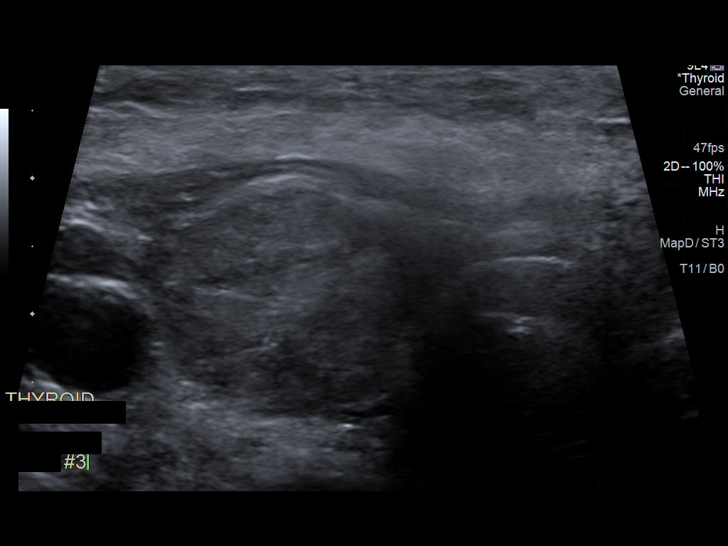
[im 27/33]
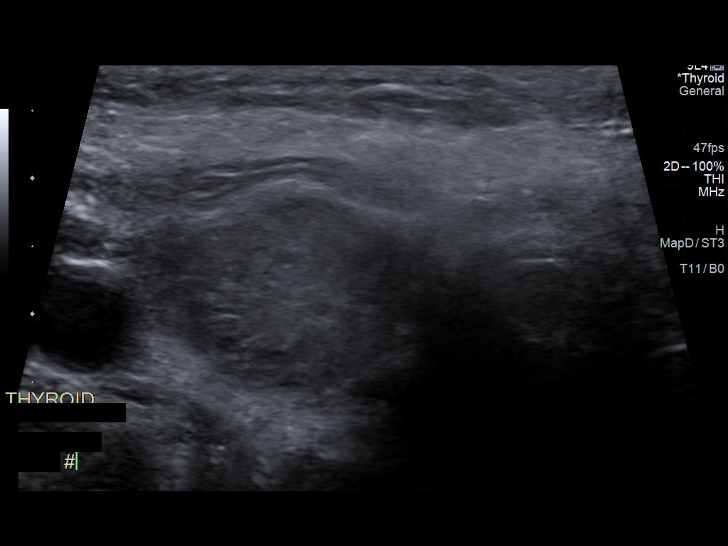
[im 30/33]
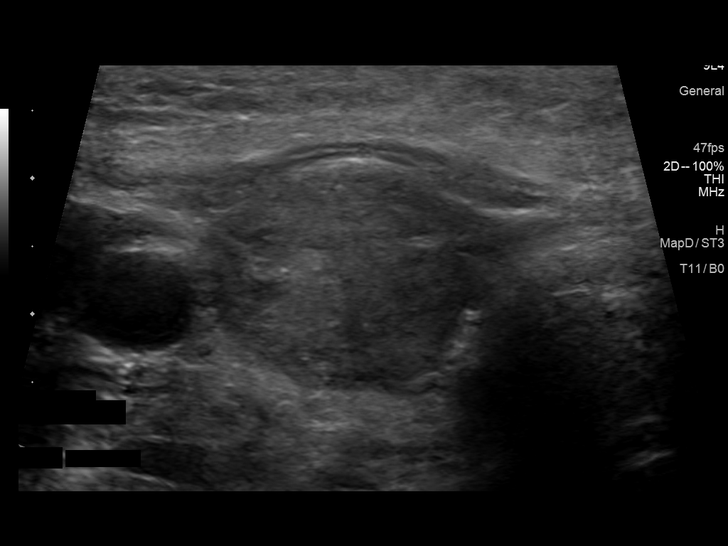
[im 33/33]
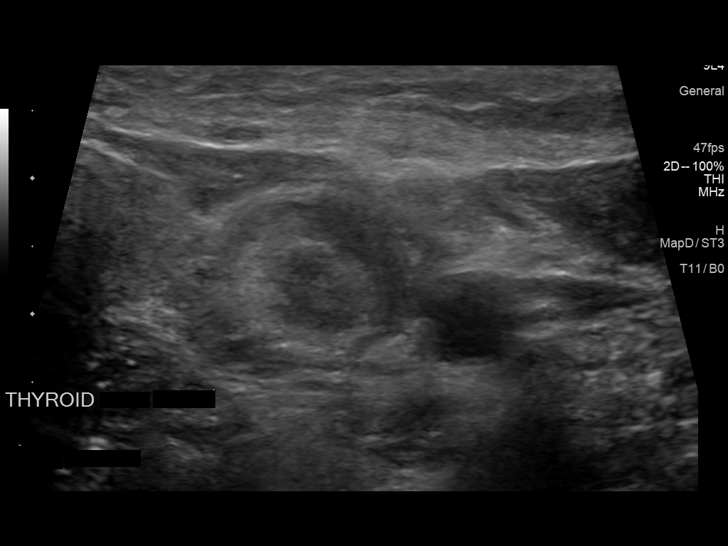

[13 of 25 positions shown; findings below may reference images not displayed]

Previous biopsy Abubakar Yahaya Aruwa [DATE] /8585 of Rt superior thyroid
nodule: Benign

Previous biopsy Abubakar Yahaya Aruwa 08/29/2019 od Lt mid lobe thyroid
nodule: Insufficient

MEDICATIONS:
10 cc 1% lidocaine

COMPLICATIONS:
None immediate.
Pre-procedural ultrasound scanning demonstrated unchanged size and
appearance of the indeterminate nodules within the right and left
thyroid

The procedure was planned. The neck was prepped in the usual sterile
fashion, and a sterile drape was applied covering the operative
field. A timeout was performed prior to the initiation of the
procedure. Local anesthesia was provided with 1% lidocaine.

Under direct ultrasound guidance, 5 FNA biopsies were performed of
the right inferior thyroid nodule with a 25 gauge needle.

2 of these samples were obtained for AFIRMA per ordering TIGER

Multiple ultrasound images were saved for procedural documentation
purposes. The samples were prepared and submitted to pathology.

Under direct ultrasound guidance, 5 FNA biopsies were performed of
the left mid lobe thyroid nodule with a 25 gauge needle.

2 of these samples were obtained for AFIRMA per ordering TIGER

Multiple ultrasound images were saved for procedural documentation
purposes. The samples were prepared and submitted to pathology.

Limited post procedural scanning was negative for hematoma or
additional complication. Dressings were placed. The patient
tolerated the above procedures procedure well without immediate
postprocedural complication.
FINDINGS: Nodule reference number based on prior diagnostic ultrasound: 2

Maximum size: 2.5 cm

Location: Right; Inferior

ACR TI-RADS risk category: TR4 (4-6 points)

Reason for biopsy: meets ACR TI-RADS criteria

_________________________________________________________

Nodule reference number based on prior diagnostic ultrasound: 3

Maximum size: 2.7 cm

Location: Left; Mid

ACR TI-RADS risk category: TR4 (4-6 points)

Reason for biopsy: meets ACR TI-RADS criteria

Ultrasound imaging confirms appropriate placement of the needles
within the thyroid nodule.
IMPRESSION: 1. Technically successful ultrasound guided fine needle aspiration
of right inferior thyroid nodule
2. Technically successful ultrasound guided fine needle aspiration
of left mid lobe thyroid nodule

Read by

Sawaf Bonthu

## 2022-04-17 DIAGNOSIS — M48061 Spinal stenosis, lumbar region without neurogenic claudication: Secondary | ICD-10-CM | POA: Diagnosis not present

## 2022-04-17 DIAGNOSIS — I10 Essential (primary) hypertension: Secondary | ICD-10-CM | POA: Diagnosis not present

## 2022-04-17 DIAGNOSIS — G894 Chronic pain syndrome: Secondary | ICD-10-CM | POA: Diagnosis not present

## 2022-04-22 DIAGNOSIS — G629 Polyneuropathy, unspecified: Secondary | ICD-10-CM | POA: Diagnosis not present

## 2022-04-22 DIAGNOSIS — E669 Obesity, unspecified: Secondary | ICD-10-CM | POA: Diagnosis not present

## 2022-04-22 DIAGNOSIS — Z981 Arthrodesis status: Secondary | ICD-10-CM | POA: Diagnosis not present

## 2022-04-22 DIAGNOSIS — M47816 Spondylosis without myelopathy or radiculopathy, lumbar region: Secondary | ICD-10-CM | POA: Diagnosis not present

## 2022-04-22 DIAGNOSIS — M5136 Other intervertebral disc degeneration, lumbar region: Secondary | ICD-10-CM | POA: Diagnosis not present

## 2022-04-22 DIAGNOSIS — Z79891 Long term (current) use of opiate analgesic: Secondary | ICD-10-CM | POA: Diagnosis not present

## 2022-04-22 DIAGNOSIS — Z1389 Encounter for screening for other disorder: Secondary | ICD-10-CM | POA: Diagnosis not present

## 2022-04-22 DIAGNOSIS — I83813 Varicose veins of bilateral lower extremities with pain: Secondary | ICD-10-CM | POA: Diagnosis not present

## 2022-05-06 DIAGNOSIS — I10 Essential (primary) hypertension: Secondary | ICD-10-CM | POA: Diagnosis not present

## 2022-05-06 DIAGNOSIS — M5416 Radiculopathy, lumbar region: Secondary | ICD-10-CM | POA: Diagnosis not present

## 2022-05-22 DIAGNOSIS — G629 Polyneuropathy, unspecified: Secondary | ICD-10-CM | POA: Diagnosis not present

## 2022-05-22 DIAGNOSIS — I83813 Varicose veins of bilateral lower extremities with pain: Secondary | ICD-10-CM | POA: Diagnosis not present

## 2022-05-22 DIAGNOSIS — M65319 Trigger thumb, unspecified thumb: Secondary | ICD-10-CM | POA: Diagnosis not present

## 2022-05-22 DIAGNOSIS — Z79891 Long term (current) use of opiate analgesic: Secondary | ICD-10-CM | POA: Diagnosis not present

## 2022-05-22 DIAGNOSIS — M5136 Other intervertebral disc degeneration, lumbar region: Secondary | ICD-10-CM | POA: Diagnosis not present

## 2022-05-22 DIAGNOSIS — E669 Obesity, unspecified: Secondary | ICD-10-CM | POA: Diagnosis not present

## 2022-05-22 DIAGNOSIS — Z981 Arthrodesis status: Secondary | ICD-10-CM | POA: Diagnosis not present

## 2022-05-22 DIAGNOSIS — Z1389 Encounter for screening for other disorder: Secondary | ICD-10-CM | POA: Diagnosis not present

## 2022-05-22 DIAGNOSIS — M47816 Spondylosis without myelopathy or radiculopathy, lumbar region: Secondary | ICD-10-CM | POA: Diagnosis not present

## 2022-06-10 DIAGNOSIS — G894 Chronic pain syndrome: Secondary | ICD-10-CM | POA: Diagnosis not present

## 2022-06-10 DIAGNOSIS — I10 Essential (primary) hypertension: Secondary | ICD-10-CM | POA: Diagnosis not present

## 2022-06-10 DIAGNOSIS — M65311 Trigger thumb, right thumb: Secondary | ICD-10-CM | POA: Diagnosis not present

## 2022-06-10 DIAGNOSIS — M5136 Other intervertebral disc degeneration, lumbar region: Secondary | ICD-10-CM | POA: Diagnosis not present

## 2022-07-01 DIAGNOSIS — M5136 Other intervertebral disc degeneration, lumbar region: Secondary | ICD-10-CM | POA: Diagnosis not present

## 2022-07-01 DIAGNOSIS — G894 Chronic pain syndrome: Secondary | ICD-10-CM | POA: Diagnosis not present

## 2022-07-01 DIAGNOSIS — Z1389 Encounter for screening for other disorder: Secondary | ICD-10-CM | POA: Diagnosis not present

## 2022-07-01 DIAGNOSIS — M65319 Trigger thumb, unspecified thumb: Secondary | ICD-10-CM | POA: Diagnosis not present

## 2022-07-01 DIAGNOSIS — M47816 Spondylosis without myelopathy or radiculopathy, lumbar region: Secondary | ICD-10-CM | POA: Diagnosis not present

## 2022-07-01 DIAGNOSIS — E669 Obesity, unspecified: Secondary | ICD-10-CM | POA: Diagnosis not present

## 2022-07-01 DIAGNOSIS — G629 Polyneuropathy, unspecified: Secondary | ICD-10-CM | POA: Diagnosis not present

## 2022-07-01 DIAGNOSIS — Z981 Arthrodesis status: Secondary | ICD-10-CM | POA: Diagnosis not present

## 2022-07-01 DIAGNOSIS — I83813 Varicose veins of bilateral lower extremities with pain: Secondary | ICD-10-CM | POA: Diagnosis not present

## 2022-07-01 DIAGNOSIS — Z79891 Long term (current) use of opiate analgesic: Secondary | ICD-10-CM | POA: Diagnosis not present

## 2022-07-29 DIAGNOSIS — G629 Polyneuropathy, unspecified: Secondary | ICD-10-CM | POA: Diagnosis not present

## 2022-07-29 DIAGNOSIS — E669 Obesity, unspecified: Secondary | ICD-10-CM | POA: Diagnosis not present

## 2022-07-29 DIAGNOSIS — M5136 Other intervertebral disc degeneration, lumbar region: Secondary | ICD-10-CM | POA: Diagnosis not present

## 2022-07-29 DIAGNOSIS — Z1389 Encounter for screening for other disorder: Secondary | ICD-10-CM | POA: Diagnosis not present

## 2022-07-29 DIAGNOSIS — M65319 Trigger thumb, unspecified thumb: Secondary | ICD-10-CM | POA: Diagnosis not present

## 2022-07-29 DIAGNOSIS — M47816 Spondylosis without myelopathy or radiculopathy, lumbar region: Secondary | ICD-10-CM | POA: Diagnosis not present

## 2022-07-29 DIAGNOSIS — I83813 Varicose veins of bilateral lower extremities with pain: Secondary | ICD-10-CM | POA: Diagnosis not present

## 2022-07-29 DIAGNOSIS — Z79891 Long term (current) use of opiate analgesic: Secondary | ICD-10-CM | POA: Diagnosis not present

## 2022-07-29 DIAGNOSIS — Z981 Arthrodesis status: Secondary | ICD-10-CM | POA: Diagnosis not present

## 2022-08-01 DIAGNOSIS — I10 Essential (primary) hypertension: Secondary | ICD-10-CM | POA: Diagnosis not present

## 2022-08-01 DIAGNOSIS — M5416 Radiculopathy, lumbar region: Secondary | ICD-10-CM | POA: Diagnosis not present

## 2022-08-26 DIAGNOSIS — M48061 Spinal stenosis, lumbar region without neurogenic claudication: Secondary | ICD-10-CM | POA: Diagnosis not present

## 2022-08-26 DIAGNOSIS — I10 Essential (primary) hypertension: Secondary | ICD-10-CM | POA: Diagnosis not present

## 2022-08-27 DIAGNOSIS — E669 Obesity, unspecified: Secondary | ICD-10-CM | POA: Diagnosis not present

## 2022-08-27 DIAGNOSIS — Z981 Arthrodesis status: Secondary | ICD-10-CM | POA: Diagnosis not present

## 2022-08-27 DIAGNOSIS — G629 Polyneuropathy, unspecified: Secondary | ICD-10-CM | POA: Diagnosis not present

## 2022-08-27 DIAGNOSIS — Z79891 Long term (current) use of opiate analgesic: Secondary | ICD-10-CM | POA: Diagnosis not present

## 2022-08-27 DIAGNOSIS — I83813 Varicose veins of bilateral lower extremities with pain: Secondary | ICD-10-CM | POA: Diagnosis not present

## 2022-08-27 DIAGNOSIS — M47816 Spondylosis without myelopathy or radiculopathy, lumbar region: Secondary | ICD-10-CM | POA: Diagnosis not present

## 2022-08-27 DIAGNOSIS — M5136 Other intervertebral disc degeneration, lumbar region: Secondary | ICD-10-CM | POA: Diagnosis not present

## 2022-08-27 DIAGNOSIS — M65319 Trigger thumb, unspecified thumb: Secondary | ICD-10-CM | POA: Diagnosis not present

## 2022-08-27 DIAGNOSIS — Z1389 Encounter for screening for other disorder: Secondary | ICD-10-CM | POA: Diagnosis not present

## 2022-09-03 DIAGNOSIS — U071 COVID-19: Secondary | ICD-10-CM | POA: Diagnosis not present

## 2022-09-03 DIAGNOSIS — N3000 Acute cystitis without hematuria: Secondary | ICD-10-CM | POA: Diagnosis not present

## 2022-09-03 DIAGNOSIS — J029 Acute pharyngitis, unspecified: Secondary | ICD-10-CM | POA: Diagnosis not present

## 2022-09-03 DIAGNOSIS — R509 Fever, unspecified: Secondary | ICD-10-CM | POA: Diagnosis not present

## 2022-09-10 DIAGNOSIS — Z7689 Persons encountering health services in other specified circumstances: Secondary | ICD-10-CM | POA: Diagnosis not present

## 2022-09-10 DIAGNOSIS — Z8616 Personal history of COVID-19: Secondary | ICD-10-CM | POA: Diagnosis not present

## 2022-09-16 DIAGNOSIS — I10 Essential (primary) hypertension: Secondary | ICD-10-CM | POA: Diagnosis not present

## 2022-09-16 DIAGNOSIS — E785 Hyperlipidemia, unspecified: Secondary | ICD-10-CM | POA: Diagnosis not present

## 2022-09-16 DIAGNOSIS — E0590 Thyrotoxicosis, unspecified without thyrotoxic crisis or storm: Secondary | ICD-10-CM | POA: Diagnosis not present

## 2022-09-16 DIAGNOSIS — R7303 Prediabetes: Secondary | ICD-10-CM | POA: Diagnosis not present

## 2022-09-23 DIAGNOSIS — E663 Overweight: Secondary | ICD-10-CM | POA: Diagnosis not present

## 2022-09-23 DIAGNOSIS — Z136 Encounter for screening for cardiovascular disorders: Secondary | ICD-10-CM | POA: Diagnosis not present

## 2022-09-23 DIAGNOSIS — Z139 Encounter for screening, unspecified: Secondary | ICD-10-CM | POA: Diagnosis not present

## 2022-09-23 DIAGNOSIS — E785 Hyperlipidemia, unspecified: Secondary | ICD-10-CM | POA: Diagnosis not present

## 2022-09-23 DIAGNOSIS — Z6837 Body mass index (BMI) 37.0-37.9, adult: Secondary | ICD-10-CM | POA: Diagnosis not present

## 2022-09-23 DIAGNOSIS — E0590 Thyrotoxicosis, unspecified without thyrotoxic crisis or storm: Secondary | ICD-10-CM | POA: Diagnosis not present

## 2022-09-23 DIAGNOSIS — I10 Essential (primary) hypertension: Secondary | ICD-10-CM | POA: Diagnosis not present

## 2022-09-23 DIAGNOSIS — Z0000 Encounter for general adult medical examination without abnormal findings: Secondary | ICD-10-CM | POA: Diagnosis not present

## 2022-09-23 DIAGNOSIS — Z1339 Encounter for screening examination for other mental health and behavioral disorders: Secondary | ICD-10-CM | POA: Diagnosis not present

## 2022-09-23 DIAGNOSIS — Z1331 Encounter for screening for depression: Secondary | ICD-10-CM | POA: Diagnosis not present

## 2022-09-23 DIAGNOSIS — R7303 Prediabetes: Secondary | ICD-10-CM | POA: Diagnosis not present

## 2022-09-23 DIAGNOSIS — Z23 Encounter for immunization: Secondary | ICD-10-CM | POA: Diagnosis not present

## 2022-09-25 DIAGNOSIS — M47816 Spondylosis without myelopathy or radiculopathy, lumbar region: Secondary | ICD-10-CM | POA: Diagnosis not present

## 2022-09-25 DIAGNOSIS — I83813 Varicose veins of bilateral lower extremities with pain: Secondary | ICD-10-CM | POA: Diagnosis not present

## 2022-09-25 DIAGNOSIS — E669 Obesity, unspecified: Secondary | ICD-10-CM | POA: Diagnosis not present

## 2022-09-25 DIAGNOSIS — Z981 Arthrodesis status: Secondary | ICD-10-CM | POA: Diagnosis not present

## 2022-09-25 DIAGNOSIS — M65319 Trigger thumb, unspecified thumb: Secondary | ICD-10-CM | POA: Diagnosis not present

## 2022-09-25 DIAGNOSIS — G629 Polyneuropathy, unspecified: Secondary | ICD-10-CM | POA: Diagnosis not present

## 2022-09-25 DIAGNOSIS — Z79891 Long term (current) use of opiate analgesic: Secondary | ICD-10-CM | POA: Diagnosis not present

## 2022-09-25 DIAGNOSIS — M5136 Other intervertebral disc degeneration, lumbar region: Secondary | ICD-10-CM | POA: Diagnosis not present

## 2022-09-25 DIAGNOSIS — Z1389 Encounter for screening for other disorder: Secondary | ICD-10-CM | POA: Diagnosis not present

## 2022-10-07 DIAGNOSIS — Z23 Encounter for immunization: Secondary | ICD-10-CM | POA: Diagnosis not present

## 2022-10-07 DIAGNOSIS — Z1231 Encounter for screening mammogram for malignant neoplasm of breast: Secondary | ICD-10-CM | POA: Diagnosis not present

## 2022-10-07 DIAGNOSIS — E2839 Other primary ovarian failure: Secondary | ICD-10-CM | POA: Diagnosis not present

## 2022-10-07 DIAGNOSIS — Z6839 Body mass index (BMI) 39.0-39.9, adult: Secondary | ICD-10-CM | POA: Diagnosis not present

## 2022-10-07 DIAGNOSIS — G3184 Mild cognitive impairment, so stated: Secondary | ICD-10-CM | POA: Diagnosis not present

## 2022-10-07 DIAGNOSIS — G479 Sleep disorder, unspecified: Secondary | ICD-10-CM | POA: Diagnosis not present

## 2022-10-09 DIAGNOSIS — M5136 Other intervertebral disc degeneration, lumbar region: Secondary | ICD-10-CM | POA: Diagnosis not present

## 2022-10-09 DIAGNOSIS — M48061 Spinal stenosis, lumbar region without neurogenic claudication: Secondary | ICD-10-CM | POA: Diagnosis not present

## 2022-10-09 DIAGNOSIS — I10 Essential (primary) hypertension: Secondary | ICD-10-CM | POA: Diagnosis not present

## 2022-10-28 DIAGNOSIS — M47816 Spondylosis without myelopathy or radiculopathy, lumbar region: Secondary | ICD-10-CM | POA: Diagnosis not present

## 2022-10-28 DIAGNOSIS — E669 Obesity, unspecified: Secondary | ICD-10-CM | POA: Diagnosis not present

## 2022-10-28 DIAGNOSIS — Z79891 Long term (current) use of opiate analgesic: Secondary | ICD-10-CM | POA: Diagnosis not present

## 2022-10-28 DIAGNOSIS — Z1389 Encounter for screening for other disorder: Secondary | ICD-10-CM | POA: Diagnosis not present

## 2022-10-28 DIAGNOSIS — Z981 Arthrodesis status: Secondary | ICD-10-CM | POA: Diagnosis not present

## 2022-10-28 DIAGNOSIS — G629 Polyneuropathy, unspecified: Secondary | ICD-10-CM | POA: Diagnosis not present

## 2022-10-28 DIAGNOSIS — M5136 Other intervertebral disc degeneration, lumbar region: Secondary | ICD-10-CM | POA: Diagnosis not present

## 2022-10-28 DIAGNOSIS — M65319 Trigger thumb, unspecified thumb: Secondary | ICD-10-CM | POA: Diagnosis not present

## 2022-10-28 DIAGNOSIS — I83813 Varicose veins of bilateral lower extremities with pain: Secondary | ICD-10-CM | POA: Diagnosis not present

## 2022-11-05 DIAGNOSIS — I10 Essential (primary) hypertension: Secondary | ICD-10-CM | POA: Diagnosis not present

## 2022-11-05 DIAGNOSIS — G894 Chronic pain syndrome: Secondary | ICD-10-CM | POA: Diagnosis not present

## 2022-11-05 DIAGNOSIS — G629 Polyneuropathy, unspecified: Secondary | ICD-10-CM | POA: Diagnosis not present

## 2022-11-25 DIAGNOSIS — J9601 Acute respiratory failure with hypoxia: Secondary | ICD-10-CM | POA: Diagnosis not present

## 2022-11-25 DIAGNOSIS — R0902 Hypoxemia: Secondary | ICD-10-CM | POA: Diagnosis not present

## 2022-11-25 DIAGNOSIS — Z1331 Encounter for screening for depression: Secondary | ICD-10-CM | POA: Diagnosis not present

## 2022-11-25 DIAGNOSIS — Z20822 Contact with and (suspected) exposure to covid-19: Secondary | ICD-10-CM | POA: Diagnosis not present

## 2022-11-25 DIAGNOSIS — Z6839 Body mass index (BMI) 39.0-39.9, adult: Secondary | ICD-10-CM | POA: Diagnosis not present

## 2022-11-25 DIAGNOSIS — Z139 Encounter for screening, unspecified: Secondary | ICD-10-CM | POA: Diagnosis not present

## 2022-11-25 DIAGNOSIS — R0602 Shortness of breath: Secondary | ICD-10-CM | POA: Diagnosis not present

## 2022-11-26 DIAGNOSIS — I700 Atherosclerosis of aorta: Secondary | ICD-10-CM | POA: Diagnosis not present

## 2022-11-26 DIAGNOSIS — R0902 Hypoxemia: Secondary | ICD-10-CM | POA: Diagnosis not present

## 2022-11-26 DIAGNOSIS — R0602 Shortness of breath: Secondary | ICD-10-CM | POA: Diagnosis not present

## 2022-11-27 DIAGNOSIS — R7989 Other specified abnormal findings of blood chemistry: Secondary | ICD-10-CM | POA: Diagnosis not present

## 2022-11-27 DIAGNOSIS — R0602 Shortness of breath: Secondary | ICD-10-CM | POA: Diagnosis not present

## 2022-11-27 DIAGNOSIS — R0902 Hypoxemia: Secondary | ICD-10-CM | POA: Diagnosis not present

## 2022-11-27 DIAGNOSIS — Z139 Encounter for screening, unspecified: Secondary | ICD-10-CM | POA: Diagnosis not present

## 2022-11-28 DIAGNOSIS — I10 Essential (primary) hypertension: Secondary | ICD-10-CM | POA: Diagnosis not present

## 2022-11-28 DIAGNOSIS — M5136 Other intervertebral disc degeneration, lumbar region: Secondary | ICD-10-CM | POA: Diagnosis not present

## 2022-11-28 DIAGNOSIS — M48061 Spinal stenosis, lumbar region without neurogenic claudication: Secondary | ICD-10-CM | POA: Diagnosis not present

## 2022-12-02 DIAGNOSIS — Z1389 Encounter for screening for other disorder: Secondary | ICD-10-CM | POA: Diagnosis not present

## 2022-12-02 DIAGNOSIS — E669 Obesity, unspecified: Secondary | ICD-10-CM | POA: Diagnosis not present

## 2022-12-02 DIAGNOSIS — Z981 Arthrodesis status: Secondary | ICD-10-CM | POA: Diagnosis not present

## 2022-12-02 DIAGNOSIS — I83813 Varicose veins of bilateral lower extremities with pain: Secondary | ICD-10-CM | POA: Diagnosis not present

## 2022-12-02 DIAGNOSIS — Z79891 Long term (current) use of opiate analgesic: Secondary | ICD-10-CM | POA: Diagnosis not present

## 2022-12-02 DIAGNOSIS — M65319 Trigger thumb, unspecified thumb: Secondary | ICD-10-CM | POA: Diagnosis not present

## 2022-12-02 DIAGNOSIS — G629 Polyneuropathy, unspecified: Secondary | ICD-10-CM | POA: Diagnosis not present

## 2022-12-02 DIAGNOSIS — M5136 Other intervertebral disc degeneration, lumbar region: Secondary | ICD-10-CM | POA: Diagnosis not present

## 2022-12-02 DIAGNOSIS — M47816 Spondylosis without myelopathy or radiculopathy, lumbar region: Secondary | ICD-10-CM | POA: Diagnosis not present

## 2022-12-03 DIAGNOSIS — E2839 Other primary ovarian failure: Secondary | ICD-10-CM | POA: Diagnosis not present

## 2022-12-03 DIAGNOSIS — Z1231 Encounter for screening mammogram for malignant neoplasm of breast: Secondary | ICD-10-CM | POA: Diagnosis not present

## 2022-12-03 DIAGNOSIS — R0602 Shortness of breath: Secondary | ICD-10-CM | POA: Diagnosis not present

## 2022-12-03 DIAGNOSIS — M8580 Other specified disorders of bone density and structure, unspecified site: Secondary | ICD-10-CM | POA: Diagnosis not present

## 2022-12-03 DIAGNOSIS — M85851 Other specified disorders of bone density and structure, right thigh: Secondary | ICD-10-CM | POA: Diagnosis not present

## 2022-12-04 DIAGNOSIS — J449 Chronic obstructive pulmonary disease, unspecified: Secondary | ICD-10-CM | POA: Diagnosis not present

## 2022-12-11 DIAGNOSIS — E0590 Thyrotoxicosis, unspecified without thyrotoxic crisis or storm: Secondary | ICD-10-CM | POA: Diagnosis not present

## 2022-12-11 DIAGNOSIS — R079 Chest pain, unspecified: Secondary | ICD-10-CM | POA: Diagnosis not present

## 2022-12-11 DIAGNOSIS — M8580 Other specified disorders of bone density and structure, unspecified site: Secondary | ICD-10-CM | POA: Diagnosis not present

## 2022-12-11 DIAGNOSIS — R0602 Shortness of breath: Secondary | ICD-10-CM | POA: Diagnosis not present

## 2022-12-11 DIAGNOSIS — Z6841 Body Mass Index (BMI) 40.0 and over, adult: Secondary | ICD-10-CM | POA: Diagnosis not present

## 2022-12-23 DIAGNOSIS — R21 Rash and other nonspecific skin eruption: Secondary | ICD-10-CM | POA: Diagnosis not present

## 2022-12-23 DIAGNOSIS — Z6841 Body Mass Index (BMI) 40.0 and over, adult: Secondary | ICD-10-CM | POA: Diagnosis not present

## 2022-12-23 DIAGNOSIS — Z139 Encounter for screening, unspecified: Secondary | ICD-10-CM | POA: Diagnosis not present

## 2022-12-23 DIAGNOSIS — R0602 Shortness of breath: Secondary | ICD-10-CM | POA: Diagnosis not present

## 2022-12-23 DIAGNOSIS — R600 Localized edema: Secondary | ICD-10-CM | POA: Diagnosis not present

## 2022-12-24 DIAGNOSIS — R0602 Shortness of breath: Secondary | ICD-10-CM | POA: Diagnosis not present

## 2022-12-24 DIAGNOSIS — R079 Chest pain, unspecified: Secondary | ICD-10-CM | POA: Diagnosis not present

## 2022-12-29 DIAGNOSIS — R079 Chest pain, unspecified: Secondary | ICD-10-CM | POA: Diagnosis not present

## 2022-12-31 DIAGNOSIS — I83813 Varicose veins of bilateral lower extremities with pain: Secondary | ICD-10-CM | POA: Diagnosis not present

## 2022-12-31 DIAGNOSIS — Z981 Arthrodesis status: Secondary | ICD-10-CM | POA: Diagnosis not present

## 2022-12-31 DIAGNOSIS — E669 Obesity, unspecified: Secondary | ICD-10-CM | POA: Diagnosis not present

## 2022-12-31 DIAGNOSIS — Z1389 Encounter for screening for other disorder: Secondary | ICD-10-CM | POA: Diagnosis not present

## 2022-12-31 DIAGNOSIS — G629 Polyneuropathy, unspecified: Secondary | ICD-10-CM | POA: Diagnosis not present

## 2022-12-31 DIAGNOSIS — M65319 Trigger thumb, unspecified thumb: Secondary | ICD-10-CM | POA: Diagnosis not present

## 2022-12-31 DIAGNOSIS — M47816 Spondylosis without myelopathy or radiculopathy, lumbar region: Secondary | ICD-10-CM | POA: Diagnosis not present

## 2022-12-31 DIAGNOSIS — G894 Chronic pain syndrome: Secondary | ICD-10-CM | POA: Diagnosis not present

## 2022-12-31 DIAGNOSIS — M5136 Other intervertebral disc degeneration, lumbar region: Secondary | ICD-10-CM | POA: Diagnosis not present

## 2022-12-31 DIAGNOSIS — Z79891 Long term (current) use of opiate analgesic: Secondary | ICD-10-CM | POA: Diagnosis not present

## 2023-01-04 DIAGNOSIS — J449 Chronic obstructive pulmonary disease, unspecified: Secondary | ICD-10-CM | POA: Diagnosis not present

## 2023-01-06 DIAGNOSIS — R0602 Shortness of breath: Secondary | ICD-10-CM | POA: Diagnosis not present

## 2023-01-06 DIAGNOSIS — I10 Essential (primary) hypertension: Secondary | ICD-10-CM | POA: Diagnosis not present

## 2023-01-06 DIAGNOSIS — R9439 Abnormal result of other cardiovascular function study: Secondary | ICD-10-CM | POA: Diagnosis not present

## 2023-01-06 DIAGNOSIS — Z1231 Encounter for screening mammogram for malignant neoplasm of breast: Secondary | ICD-10-CM | POA: Diagnosis not present

## 2023-01-06 DIAGNOSIS — Z6841 Body Mass Index (BMI) 40.0 and over, adult: Secondary | ICD-10-CM | POA: Diagnosis not present

## 2023-01-14 DIAGNOSIS — I10 Essential (primary) hypertension: Secondary | ICD-10-CM | POA: Diagnosis not present

## 2023-01-14 DIAGNOSIS — E669 Obesity, unspecified: Secondary | ICD-10-CM | POA: Diagnosis not present

## 2023-01-14 DIAGNOSIS — M5416 Radiculopathy, lumbar region: Secondary | ICD-10-CM | POA: Diagnosis not present

## 2023-01-26 DIAGNOSIS — Z8616 Personal history of COVID-19: Secondary | ICD-10-CM | POA: Diagnosis not present

## 2023-01-26 DIAGNOSIS — G4733 Obstructive sleep apnea (adult) (pediatric): Secondary | ICD-10-CM | POA: Diagnosis not present

## 2023-01-26 DIAGNOSIS — I270 Primary pulmonary hypertension: Secondary | ICD-10-CM | POA: Diagnosis not present

## 2023-01-26 DIAGNOSIS — R5383 Other fatigue: Secondary | ICD-10-CM | POA: Diagnosis not present

## 2023-01-26 DIAGNOSIS — R400 Somnolence: Secondary | ICD-10-CM | POA: Diagnosis not present

## 2023-01-26 DIAGNOSIS — J4540 Moderate persistent asthma, uncomplicated: Secondary | ICD-10-CM | POA: Diagnosis not present

## 2023-01-26 DIAGNOSIS — R0600 Dyspnea, unspecified: Secondary | ICD-10-CM | POA: Diagnosis not present

## 2023-01-27 DIAGNOSIS — I83813 Varicose veins of bilateral lower extremities with pain: Secondary | ICD-10-CM | POA: Diagnosis not present

## 2023-01-27 DIAGNOSIS — M65319 Trigger thumb, unspecified thumb: Secondary | ICD-10-CM | POA: Diagnosis not present

## 2023-01-27 DIAGNOSIS — Z79891 Long term (current) use of opiate analgesic: Secondary | ICD-10-CM | POA: Diagnosis not present

## 2023-01-27 DIAGNOSIS — Z981 Arthrodesis status: Secondary | ICD-10-CM | POA: Diagnosis not present

## 2023-01-27 DIAGNOSIS — M5136 Other intervertebral disc degeneration, lumbar region: Secondary | ICD-10-CM | POA: Diagnosis not present

## 2023-01-27 DIAGNOSIS — G629 Polyneuropathy, unspecified: Secondary | ICD-10-CM | POA: Diagnosis not present

## 2023-01-27 DIAGNOSIS — M47816 Spondylosis without myelopathy or radiculopathy, lumbar region: Secondary | ICD-10-CM | POA: Diagnosis not present

## 2023-01-27 DIAGNOSIS — E669 Obesity, unspecified: Secondary | ICD-10-CM | POA: Diagnosis not present

## 2023-01-27 DIAGNOSIS — Z1389 Encounter for screening for other disorder: Secondary | ICD-10-CM | POA: Diagnosis not present

## 2023-01-27 DIAGNOSIS — E662 Morbid (severe) obesity with alveolar hypoventilation: Secondary | ICD-10-CM | POA: Diagnosis not present

## 2023-01-28 ENCOUNTER — Encounter: Payer: Self-pay | Admitting: Cardiology

## 2023-01-31 NOTE — Progress Notes (Unsigned)
Cardiology Office Note:    Date:  01/31/2023   ID:  Leslie Werner, DOB Jan 30, 1951, MRN [id number]  PCP:  Marco Collie, MD  Cardiologist:  Shirlee More, MD   Referring MD: Lawson Radar, PA  ASSESSMENT:    No diagnosis found. PLAN:    In order of problems listed above:  ***  Next appointment   Medication Adjustments/Labs and Tests Ordered: Current medicines are reviewed at length with the patient today.  Concerns regarding medicines are outlined above.  No orders of the defined types were placed in this encounter.  No orders of the defined types were placed in this encounter.    No chief complaint on file. ***  History of Present Illness:    Leslie Werner is a 72 y.o. female with a history hypertension hyper lipidemia obesity with a BMI of exceeding 40 former cigarette smoker who is being seen today for the evaluation of abnormal myocardial perfusion study at the request of Lawson Radar, [place].  When seen in the office she had exercise-induced hypoxia.  She also has a history of thyroid disease nodules and thyrotoxicosis.  She had a myocardial perfusion study performed at [facility] 12/24/2022 and showed the presence of fixed defect normal left ventricular size function and no ischemia.  Clinical interpretation this attenuation.  The test was performed pharmacologically within normal EKG response.  She had an echocardiogram performed 12/03/2022 showing normal left ventricular size with moderate concentric LVH normal left ventricular ejection fraction 55 to 60% with normal left ventricular diastolic filling pressure right ventricle is normal size and function and there is no abnormality noted on valvular evaluation.  Past Medical History:  Diagnosis Date   Anal lesion    Arthritis    Back pain    Bulging lumbar disc    Cholelithiasis    Cyst of spleen    Gastric polyps    GERD (gastroesophageal reflux disease)    History of colon polyps 06/29/2018   2 9-12 mm  polps transverse and ascending colon, 14 mm sigmoid colon   History of kidney stones    History of rectal bleeding    Hyperlipidemia    Hypertension    Hypertriglyceridemia    Hypothyroidism    IBS (irritable bowel syndrome)    Incontinence of feces with fecal urgency    Internal hemorrhoids    grade 2-3   Migraine    Neuromuscular disorder ([facility])    Osteopenia    Peripheral neuropathy    due to antibiotic use   Pinched nerve    L4-L5   Pneumonia 01/2017   Prediabetes    Spinal stenosis    Thyroid goiter    Wears glasses     Past Surgical History:  Procedure Laterality Date   APPENDECTOMY  2018   CERVICAL FUSION  2016   COLONOSCOPY W/ POLYPECTOMY  06/29/2018   ESOPHAGOGASTRODUODENOSCOPY  02/10/2013   in [place], [place], Dr. Gerri Spore   HEMORRHOID SURGERY N/A 08/03/2018   Procedure: HEMORRHOIDAL PEXY;  Surgeon: Leighton Ruff, MD;  Location: [facility];  Service: General;  Laterality: N/A;   KIDNEY STONE SURGERY     x 2   LITHOTRIPSY  2008   2-3 litho   NEPHROSTOMY     PARTIAL HYSTERECTOMY  2000   RECTAL EXAM UNDER ANESTHESIA N/A 08/03/2018   Procedure: ANAL  EXAM UNDER ANESTHESIA;  Surgeon: Leighton Ruff, MD;  Location: [facility];  Service: General;  Laterality: N/A;  Current Medications: No outpatient medications have been marked as taking for the 02/02/23 encounter (Appointment) with Richardo Priest, MD.     Allergies:   Sulfa antibiotics, Morphine and related, Nsaids, and Voltaren [diclofenac]   Social History   Socioeconomic History   Marital status: Married    Spouse name: Not on file   Number of children: 3   Years of education: Not on file   Highest education level: Not on file  Occupational History   Occupation: CNA    Comment: [employer]  Tobacco Use   Smoking status: Former    Packs/day: 1.00    Years: 15.00    Total pack years: 15.00    Types: Cigarettes    Quit date: 2005    Years since  quitting: 19.1   Smokeless tobacco: Never  Vaping Use   Vaping Use: Never used  Substance and Sexual Activity   Alcohol use: Not Currently    Comment: rare   Drug use: No   Sexual activity: Yes    Birth control/protection: Surgical  Other Topics Concern   Not on file  Social History Narrative   Not on file   Social Determinants of Health   Financial Resource Strain: Not on file  Food Insecurity: Not on file  Transportation Needs: Not on file  Physical Activity: Not on file  Stress: Not on file  Social Connections: Not on file     Family History: The patient's ***family history includes Breast cancer in her maternal grandmother and mother; Colon polyps in her mother; Diabetes in her mother; Heart disease in her maternal grandmother and mother; Hypertension in her maternal grandmother and mother; Other in her sister; Prostate cancer in her father; Stroke in her maternal grandfather. There is no history of Colon cancer, Esophageal cancer, Stomach cancer, or Rectal cancer.  ROS:   ROS Please see the history of present illness.    *** All other systems reviewed and are negative.  EKGs/Labs/Other Studies Reviewed:    The following studies were reviewed today: ***      EKG:  EKG is *** ordered today.  The ekg ordered today is personally reviewed and demonstrates ***  Recent Labs: No results found for requested labs within last 365 days.  Recent Lipid Panel No results found for: "CHOL", "TRIG", "HDL", "CHOLHDL", "VLDL", "LDLCALC", "LDLDIRECT"  Physical Exam:    VS:  There were no vitals taken for this visit.    Wt Readings from Last 3 Encounters:  01/06/23 227 lb (103 kg)  08/01/20 187 lb (84.8 kg)  03/28/20 183 lb (83 kg)     GEN: *** Well nourished, well developed in no acute distress HEENT: Normal NECK: No JVD; No carotid bruits LYMPHATICS: No lymphadenopathy CARDIAC: ***RRR, no murmurs, rubs, gallops RESPIRATORY:  Clear to auscultation without rales, wheezing  or rhonchi  ABDOMEN: Soft, non-tender, non-distended MUSCULOSKELETAL:  No edema; No deformity  SKIN: Warm and dry NEUROLOGIC:  Alert and oriented x 3 PSYCHIATRIC:  Normal affect     Signed, Shirlee More, MD  01/31/2023 12:08 PM    [facility]

## 2023-02-02 ENCOUNTER — Ambulatory Visit: Payer: Medicare HMO | Attending: Cardiology | Admitting: Cardiology

## 2023-02-02 ENCOUNTER — Encounter: Payer: Self-pay | Admitting: Cardiology

## 2023-02-02 VITALS — BP 138/78 | HR 85 | Ht 65.0 in | Wt 232.2 lb

## 2023-02-02 DIAGNOSIS — M51369 Other intervertebral disc degeneration, lumbar region without mention of lumbar back pain or lower extremity pain: Secondary | ICD-10-CM | POA: Insufficient documentation

## 2023-02-02 DIAGNOSIS — R152 Fecal urgency: Secondary | ICD-10-CM | POA: Insufficient documentation

## 2023-02-02 DIAGNOSIS — R9439 Abnormal result of other cardiovascular function study: Secondary | ICD-10-CM | POA: Diagnosis not present

## 2023-02-02 DIAGNOSIS — K8020 Calculus of gallbladder without cholecystitis without obstruction: Secondary | ICD-10-CM | POA: Insufficient documentation

## 2023-02-02 DIAGNOSIS — Z8719 Personal history of other diseases of the digestive system: Secondary | ICD-10-CM | POA: Insufficient documentation

## 2023-02-02 DIAGNOSIS — G43909 Migraine, unspecified, not intractable, without status migrainosus: Secondary | ICD-10-CM | POA: Insufficient documentation

## 2023-02-02 DIAGNOSIS — E049 Nontoxic goiter, unspecified: Secondary | ICD-10-CM | POA: Insufficient documentation

## 2023-02-02 DIAGNOSIS — M1990 Unspecified osteoarthritis, unspecified site: Secondary | ICD-10-CM | POA: Insufficient documentation

## 2023-02-02 DIAGNOSIS — I10 Essential (primary) hypertension: Secondary | ICD-10-CM | POA: Insufficient documentation

## 2023-02-02 DIAGNOSIS — D734 Cyst of spleen: Secondary | ICD-10-CM | POA: Insufficient documentation

## 2023-02-02 DIAGNOSIS — E782 Mixed hyperlipidemia: Secondary | ICD-10-CM

## 2023-02-02 DIAGNOSIS — E039 Hypothyroidism, unspecified: Secondary | ICD-10-CM | POA: Insufficient documentation

## 2023-02-02 DIAGNOSIS — G589 Mononeuropathy, unspecified: Secondary | ICD-10-CM | POA: Insufficient documentation

## 2023-02-02 DIAGNOSIS — G629 Polyneuropathy, unspecified: Secondary | ICD-10-CM | POA: Insufficient documentation

## 2023-02-02 DIAGNOSIS — E781 Pure hyperglyceridemia: Secondary | ICD-10-CM | POA: Insufficient documentation

## 2023-02-02 DIAGNOSIS — M8580 Other specified disorders of bone density and structure, unspecified site: Secondary | ICD-10-CM | POA: Insufficient documentation

## 2023-02-02 DIAGNOSIS — K317 Polyp of stomach and duodenum: Secondary | ICD-10-CM | POA: Insufficient documentation

## 2023-02-02 DIAGNOSIS — Z87442 Personal history of urinary calculi: Secondary | ICD-10-CM | POA: Insufficient documentation

## 2023-02-02 DIAGNOSIS — M549 Dorsalgia, unspecified: Secondary | ICD-10-CM | POA: Insufficient documentation

## 2023-02-02 DIAGNOSIS — K648 Other hemorrhoids: Secondary | ICD-10-CM | POA: Insufficient documentation

## 2023-02-02 DIAGNOSIS — J449 Chronic obstructive pulmonary disease, unspecified: Secondary | ICD-10-CM | POA: Diagnosis not present

## 2023-02-02 DIAGNOSIS — R0602 Shortness of breath: Secondary | ICD-10-CM | POA: Diagnosis not present

## 2023-02-02 DIAGNOSIS — Z973 Presence of spectacles and contact lenses: Secondary | ICD-10-CM | POA: Insufficient documentation

## 2023-02-02 DIAGNOSIS — K219 Gastro-esophageal reflux disease without esophagitis: Secondary | ICD-10-CM | POA: Insufficient documentation

## 2023-02-02 DIAGNOSIS — G709 Myoneural disorder, unspecified: Secondary | ICD-10-CM | POA: Insufficient documentation

## 2023-02-02 DIAGNOSIS — M5136 Other intervertebral disc degeneration, lumbar region: Secondary | ICD-10-CM | POA: Insufficient documentation

## 2023-02-02 DIAGNOSIS — K629 Disease of anus and rectum, unspecified: Secondary | ICD-10-CM | POA: Insufficient documentation

## 2023-02-02 DIAGNOSIS — M4800 Spinal stenosis, site unspecified: Secondary | ICD-10-CM | POA: Insufficient documentation

## 2023-02-02 DIAGNOSIS — K589 Irritable bowel syndrome without diarrhea: Secondary | ICD-10-CM | POA: Insufficient documentation

## 2023-02-02 DIAGNOSIS — R7303 Prediabetes: Secondary | ICD-10-CM | POA: Insufficient documentation

## 2023-02-02 NOTE — Patient Instructions (Signed)
Medication Instructions:  Your physician recommends that you continue on your current medications as directed. Please refer to the Current Medication list given to you today.  *If you need a refill on your cardiac medications before your next appointment, please call your pharmacy*   Lab Work: Your physician recommends that you return for lab work in:   Labs today: Pro BNP  If you have labs (blood work) drawn today and your tests are completely normal, you will receive your results only by: MyChart Message (if you have MyChart) OR A paper copy in the mail If you have any lab test that is abnormal or we need to change your treatment, we will call you to review the results.   Testing/Procedures: None   Follow-Up: At [facility], you and your health needs are our priority.  As part of our continuing mission to provide you with exceptional heart care, we have created designated Provider Care Teams.  These Care Teams include your primary Cardiologist (physician) and Advanced Practice Providers (APPs -  Physician Assistants and Nurse Practitioners) who all work together to provide you with the care you need, when you need it.  We recommend signing up for the patient portal called "MyChart".  Sign up information is provided on this After Visit Summary.  MyChart is used to connect with patients for Virtual Visits (Telemedicine).  Patients are able to view lab/test results, encounter notes, upcoming appointments, etc.  Non-urgent messages can be sent to your provider as well.   To learn more about what you can do with MyChart, go to [email or web address].    Your next appointment:   Follow up as needed  Provider:   Shirlee More, MD    Other Instructions None

## 2023-02-03 LAB — PRO B NATRIURETIC PEPTIDE: NT-Pro BNP: 43 pg/mL (ref 0–301)

## 2023-02-09 DIAGNOSIS — E669 Obesity, unspecified: Secondary | ICD-10-CM | POA: Diagnosis not present

## 2023-02-09 DIAGNOSIS — I10 Essential (primary) hypertension: Secondary | ICD-10-CM | POA: Diagnosis not present

## 2023-02-09 DIAGNOSIS — G629 Polyneuropathy, unspecified: Secondary | ICD-10-CM | POA: Diagnosis not present

## 2023-02-10 DIAGNOSIS — G4733 Obstructive sleep apnea (adult) (pediatric): Secondary | ICD-10-CM | POA: Diagnosis not present

## 2023-02-10 DIAGNOSIS — J4540 Moderate persistent asthma, uncomplicated: Secondary | ICD-10-CM | POA: Diagnosis not present

## 2023-02-10 DIAGNOSIS — R5383 Other fatigue: Secondary | ICD-10-CM | POA: Diagnosis not present

## 2023-02-10 DIAGNOSIS — R400 Somnolence: Secondary | ICD-10-CM | POA: Diagnosis not present

## 2023-02-10 DIAGNOSIS — Z8616 Personal history of COVID-19: Secondary | ICD-10-CM | POA: Diagnosis not present

## 2023-02-10 DIAGNOSIS — R0600 Dyspnea, unspecified: Secondary | ICD-10-CM | POA: Diagnosis not present

## 2023-02-10 DIAGNOSIS — I270 Primary pulmonary hypertension: Secondary | ICD-10-CM | POA: Diagnosis not present

## 2023-02-18 DIAGNOSIS — R0600 Dyspnea, unspecified: Secondary | ICD-10-CM | POA: Diagnosis not present

## 2023-02-24 DIAGNOSIS — G629 Polyneuropathy, unspecified: Secondary | ICD-10-CM | POA: Diagnosis not present

## 2023-02-24 DIAGNOSIS — M47816 Spondylosis without myelopathy or radiculopathy, lumbar region: Secondary | ICD-10-CM | POA: Diagnosis not present

## 2023-02-24 DIAGNOSIS — E669 Obesity, unspecified: Secondary | ICD-10-CM | POA: Diagnosis not present

## 2023-02-24 DIAGNOSIS — I83813 Varicose veins of bilateral lower extremities with pain: Secondary | ICD-10-CM | POA: Diagnosis not present

## 2023-02-24 DIAGNOSIS — Z79891 Long term (current) use of opiate analgesic: Secondary | ICD-10-CM | POA: Diagnosis not present

## 2023-02-24 DIAGNOSIS — Z1389 Encounter for screening for other disorder: Secondary | ICD-10-CM | POA: Diagnosis not present

## 2023-02-24 DIAGNOSIS — M5136 Other intervertebral disc degeneration, lumbar region: Secondary | ICD-10-CM | POA: Diagnosis not present

## 2023-02-24 DIAGNOSIS — M65319 Trigger thumb, unspecified thumb: Secondary | ICD-10-CM | POA: Diagnosis not present

## 2023-02-24 DIAGNOSIS — Z981 Arthrodesis status: Secondary | ICD-10-CM | POA: Diagnosis not present

## 2023-02-27 DIAGNOSIS — Z8616 Personal history of COVID-19: Secondary | ICD-10-CM | POA: Diagnosis not present

## 2023-02-27 DIAGNOSIS — R400 Somnolence: Secondary | ICD-10-CM | POA: Diagnosis not present

## 2023-02-27 DIAGNOSIS — R5383 Other fatigue: Secondary | ICD-10-CM | POA: Diagnosis not present

## 2023-02-27 DIAGNOSIS — J4540 Moderate persistent asthma, uncomplicated: Secondary | ICD-10-CM | POA: Diagnosis not present

## 2023-02-27 DIAGNOSIS — G4733 Obstructive sleep apnea (adult) (pediatric): Secondary | ICD-10-CM | POA: Diagnosis not present

## 2023-02-27 DIAGNOSIS — R0600 Dyspnea, unspecified: Secondary | ICD-10-CM | POA: Diagnosis not present

## 2023-03-02 DIAGNOSIS — M5136 Other intervertebral disc degeneration, lumbar region: Secondary | ICD-10-CM | POA: Diagnosis not present

## 2023-03-02 DIAGNOSIS — G629 Polyneuropathy, unspecified: Secondary | ICD-10-CM | POA: Diagnosis not present

## 2023-03-02 DIAGNOSIS — I10 Essential (primary) hypertension: Secondary | ICD-10-CM | POA: Diagnosis not present

## 2023-03-05 DIAGNOSIS — J449 Chronic obstructive pulmonary disease, unspecified: Secondary | ICD-10-CM | POA: Diagnosis not present

## 2023-03-17 DIAGNOSIS — G4733 Obstructive sleep apnea (adult) (pediatric): Secondary | ICD-10-CM | POA: Diagnosis not present

## 2023-03-19 DIAGNOSIS — E0590 Thyrotoxicosis, unspecified without thyrotoxic crisis or storm: Secondary | ICD-10-CM | POA: Diagnosis not present

## 2023-03-19 DIAGNOSIS — R7303 Prediabetes: Secondary | ICD-10-CM | POA: Diagnosis not present

## 2023-03-19 DIAGNOSIS — E785 Hyperlipidemia, unspecified: Secondary | ICD-10-CM | POA: Diagnosis not present

## 2023-03-19 DIAGNOSIS — I10 Essential (primary) hypertension: Secondary | ICD-10-CM | POA: Diagnosis not present

## 2023-03-25 DIAGNOSIS — G629 Polyneuropathy, unspecified: Secondary | ICD-10-CM | POA: Diagnosis not present

## 2023-03-25 DIAGNOSIS — I83813 Varicose veins of bilateral lower extremities with pain: Secondary | ICD-10-CM | POA: Diagnosis not present

## 2023-03-25 DIAGNOSIS — M47816 Spondylosis without myelopathy or radiculopathy, lumbar region: Secondary | ICD-10-CM | POA: Diagnosis not present

## 2023-03-25 DIAGNOSIS — Z79891 Long term (current) use of opiate analgesic: Secondary | ICD-10-CM | POA: Diagnosis not present

## 2023-03-25 DIAGNOSIS — Z1389 Encounter for screening for other disorder: Secondary | ICD-10-CM | POA: Diagnosis not present

## 2023-03-25 DIAGNOSIS — M5136 Other intervertebral disc degeneration, lumbar region: Secondary | ICD-10-CM | POA: Diagnosis not present

## 2023-03-25 DIAGNOSIS — M65319 Trigger thumb, unspecified thumb: Secondary | ICD-10-CM | POA: Diagnosis not present

## 2023-03-25 DIAGNOSIS — M1711 Unilateral primary osteoarthritis, right knee: Secondary | ICD-10-CM | POA: Diagnosis not present

## 2023-03-25 DIAGNOSIS — E669 Obesity, unspecified: Secondary | ICD-10-CM | POA: Diagnosis not present

## 2023-03-25 DIAGNOSIS — Z981 Arthrodesis status: Secondary | ICD-10-CM | POA: Diagnosis not present

## 2023-03-25 DIAGNOSIS — M25561 Pain in right knee: Secondary | ICD-10-CM | POA: Diagnosis not present

## 2023-03-26 DIAGNOSIS — R0600 Dyspnea, unspecified: Secondary | ICD-10-CM | POA: Diagnosis not present

## 2023-03-26 DIAGNOSIS — G4736 Sleep related hypoventilation in conditions classified elsewhere: Secondary | ICD-10-CM | POA: Diagnosis not present

## 2023-03-26 DIAGNOSIS — R5383 Other fatigue: Secondary | ICD-10-CM | POA: Diagnosis not present

## 2023-03-26 DIAGNOSIS — J4540 Moderate persistent asthma, uncomplicated: Secondary | ICD-10-CM | POA: Diagnosis not present

## 2023-03-26 DIAGNOSIS — R400 Somnolence: Secondary | ICD-10-CM | POA: Diagnosis not present

## 2023-03-26 DIAGNOSIS — Z8616 Personal history of COVID-19: Secondary | ICD-10-CM | POA: Diagnosis not present

## 2023-03-31 DIAGNOSIS — M5136 Other intervertebral disc degeneration, lumbar region: Secondary | ICD-10-CM | POA: Diagnosis not present

## 2023-03-31 DIAGNOSIS — I10 Essential (primary) hypertension: Secondary | ICD-10-CM | POA: Diagnosis not present

## 2023-03-31 DIAGNOSIS — M48061 Spinal stenosis, lumbar region without neurogenic claudication: Secondary | ICD-10-CM | POA: Diagnosis not present

## 2023-04-02 DIAGNOSIS — E785 Hyperlipidemia, unspecified: Secondary | ICD-10-CM | POA: Diagnosis not present

## 2023-04-02 DIAGNOSIS — E1169 Type 2 diabetes mellitus with other specified complication: Secondary | ICD-10-CM | POA: Diagnosis not present

## 2023-04-02 DIAGNOSIS — E0590 Thyrotoxicosis, unspecified without thyrotoxic crisis or storm: Secondary | ICD-10-CM | POA: Diagnosis not present

## 2023-04-02 DIAGNOSIS — Z6841 Body Mass Index (BMI) 40.0 and over, adult: Secondary | ICD-10-CM | POA: Diagnosis not present

## 2023-04-02 DIAGNOSIS — Z23 Encounter for immunization: Secondary | ICD-10-CM | POA: Diagnosis not present

## 2023-04-02 DIAGNOSIS — I10 Essential (primary) hypertension: Secondary | ICD-10-CM | POA: Diagnosis not present

## 2023-04-04 DIAGNOSIS — J449 Chronic obstructive pulmonary disease, unspecified: Secondary | ICD-10-CM | POA: Diagnosis not present

## 2023-04-07 DIAGNOSIS — M1711 Unilateral primary osteoarthritis, right knee: Secondary | ICD-10-CM | POA: Diagnosis not present

## 2023-04-08 DIAGNOSIS — R600 Localized edema: Secondary | ICD-10-CM | POA: Diagnosis not present

## 2023-04-16 DIAGNOSIS — I872 Venous insufficiency (chronic) (peripheral): Secondary | ICD-10-CM | POA: Diagnosis not present

## 2023-04-16 DIAGNOSIS — Z6841 Body Mass Index (BMI) 40.0 and over, adult: Secondary | ICD-10-CM | POA: Diagnosis not present

## 2023-04-23 DIAGNOSIS — Z1389 Encounter for screening for other disorder: Secondary | ICD-10-CM | POA: Diagnosis not present

## 2023-04-23 DIAGNOSIS — M47816 Spondylosis without myelopathy or radiculopathy, lumbar region: Secondary | ICD-10-CM | POA: Diagnosis not present

## 2023-04-23 DIAGNOSIS — M65319 Trigger thumb, unspecified thumb: Secondary | ICD-10-CM | POA: Diagnosis not present

## 2023-04-23 DIAGNOSIS — M5136 Other intervertebral disc degeneration, lumbar region: Secondary | ICD-10-CM | POA: Diagnosis not present

## 2023-04-23 DIAGNOSIS — Z79891 Long term (current) use of opiate analgesic: Secondary | ICD-10-CM | POA: Diagnosis not present

## 2023-04-23 DIAGNOSIS — G629 Polyneuropathy, unspecified: Secondary | ICD-10-CM | POA: Diagnosis not present

## 2023-04-23 DIAGNOSIS — E669 Obesity, unspecified: Secondary | ICD-10-CM | POA: Diagnosis not present

## 2023-04-23 DIAGNOSIS — I83813 Varicose veins of bilateral lower extremities with pain: Secondary | ICD-10-CM | POA: Diagnosis not present

## 2023-04-23 DIAGNOSIS — Z981 Arthrodesis status: Secondary | ICD-10-CM | POA: Diagnosis not present

## 2023-04-27 DIAGNOSIS — J4540 Moderate persistent asthma, uncomplicated: Secondary | ICD-10-CM | POA: Diagnosis not present

## 2023-04-27 DIAGNOSIS — G4736 Sleep related hypoventilation in conditions classified elsewhere: Secondary | ICD-10-CM | POA: Diagnosis not present

## 2023-04-27 DIAGNOSIS — R0600 Dyspnea, unspecified: Secondary | ICD-10-CM | POA: Diagnosis not present

## 2023-04-29 DIAGNOSIS — R400 Somnolence: Secondary | ICD-10-CM | POA: Diagnosis not present

## 2023-04-29 DIAGNOSIS — R5383 Other fatigue: Secondary | ICD-10-CM | POA: Diagnosis not present

## 2023-04-29 DIAGNOSIS — R0600 Dyspnea, unspecified: Secondary | ICD-10-CM | POA: Diagnosis not present

## 2023-04-29 DIAGNOSIS — G4736 Sleep related hypoventilation in conditions classified elsewhere: Secondary | ICD-10-CM | POA: Diagnosis not present

## 2023-04-29 DIAGNOSIS — Z8616 Personal history of COVID-19: Secondary | ICD-10-CM | POA: Diagnosis not present

## 2023-04-29 DIAGNOSIS — J4540 Moderate persistent asthma, uncomplicated: Secondary | ICD-10-CM | POA: Diagnosis not present

## 2023-04-30 DIAGNOSIS — I10 Essential (primary) hypertension: Secondary | ICD-10-CM | POA: Diagnosis not present

## 2023-04-30 DIAGNOSIS — M5136 Other intervertebral disc degeneration, lumbar region: Secondary | ICD-10-CM | POA: Diagnosis not present

## 2023-04-30 DIAGNOSIS — M25561 Pain in right knee: Secondary | ICD-10-CM | POA: Diagnosis not present

## 2023-05-05 DIAGNOSIS — J449 Chronic obstructive pulmonary disease, unspecified: Secondary | ICD-10-CM | POA: Diagnosis not present

## 2023-05-26 DIAGNOSIS — Z1389 Encounter for screening for other disorder: Secondary | ICD-10-CM | POA: Diagnosis not present

## 2023-05-26 DIAGNOSIS — M47816 Spondylosis without myelopathy or radiculopathy, lumbar region: Secondary | ICD-10-CM | POA: Diagnosis not present

## 2023-05-26 DIAGNOSIS — G629 Polyneuropathy, unspecified: Secondary | ICD-10-CM | POA: Diagnosis not present

## 2023-05-26 DIAGNOSIS — Z79891 Long term (current) use of opiate analgesic: Secondary | ICD-10-CM | POA: Diagnosis not present

## 2023-05-26 DIAGNOSIS — E669 Obesity, unspecified: Secondary | ICD-10-CM | POA: Diagnosis not present

## 2023-05-26 DIAGNOSIS — M5136 Other intervertebral disc degeneration, lumbar region: Secondary | ICD-10-CM | POA: Diagnosis not present

## 2023-05-26 DIAGNOSIS — M65319 Trigger thumb, unspecified thumb: Secondary | ICD-10-CM | POA: Diagnosis not present

## 2023-05-26 DIAGNOSIS — I83813 Varicose veins of bilateral lower extremities with pain: Secondary | ICD-10-CM | POA: Diagnosis not present

## 2023-05-26 DIAGNOSIS — Z981 Arthrodesis status: Secondary | ICD-10-CM | POA: Diagnosis not present

## 2023-05-27 DIAGNOSIS — M47816 Spondylosis without myelopathy or radiculopathy, lumbar region: Secondary | ICD-10-CM | POA: Diagnosis not present

## 2023-05-27 DIAGNOSIS — I10 Essential (primary) hypertension: Secondary | ICD-10-CM | POA: Diagnosis not present

## 2023-05-27 DIAGNOSIS — M5416 Radiculopathy, lumbar region: Secondary | ICD-10-CM | POA: Diagnosis not present

## 2023-06-02 DIAGNOSIS — G4736 Sleep related hypoventilation in conditions classified elsewhere: Secondary | ICD-10-CM | POA: Diagnosis not present

## 2023-06-02 DIAGNOSIS — G4733 Obstructive sleep apnea (adult) (pediatric): Secondary | ICD-10-CM | POA: Diagnosis not present

## 2023-06-03 DIAGNOSIS — G4736 Sleep related hypoventilation in conditions classified elsewhere: Secondary | ICD-10-CM | POA: Diagnosis not present

## 2023-06-03 DIAGNOSIS — R400 Somnolence: Secondary | ICD-10-CM | POA: Diagnosis not present

## 2023-06-03 DIAGNOSIS — R0600 Dyspnea, unspecified: Secondary | ICD-10-CM | POA: Diagnosis not present

## 2023-06-03 DIAGNOSIS — R5383 Other fatigue: Secondary | ICD-10-CM | POA: Diagnosis not present

## 2023-06-03 DIAGNOSIS — Z8616 Personal history of COVID-19: Secondary | ICD-10-CM | POA: Diagnosis not present

## 2023-06-03 DIAGNOSIS — J4540 Moderate persistent asthma, uncomplicated: Secondary | ICD-10-CM | POA: Diagnosis not present

## 2023-06-04 DIAGNOSIS — J449 Chronic obstructive pulmonary disease, unspecified: Secondary | ICD-10-CM | POA: Diagnosis not present

## 2023-06-25 DIAGNOSIS — E0590 Thyrotoxicosis, unspecified without thyrotoxic crisis or storm: Secondary | ICD-10-CM | POA: Diagnosis not present

## 2023-06-25 DIAGNOSIS — I10 Essential (primary) hypertension: Secondary | ICD-10-CM | POA: Diagnosis not present

## 2023-06-25 DIAGNOSIS — R7303 Prediabetes: Secondary | ICD-10-CM | POA: Diagnosis not present

## 2023-06-25 DIAGNOSIS — E785 Hyperlipidemia, unspecified: Secondary | ICD-10-CM | POA: Diagnosis not present

## 2023-06-29 DIAGNOSIS — E669 Obesity, unspecified: Secondary | ICD-10-CM | POA: Diagnosis not present

## 2023-06-29 DIAGNOSIS — M5136 Other intervertebral disc degeneration, lumbar region: Secondary | ICD-10-CM | POA: Diagnosis not present

## 2023-06-29 DIAGNOSIS — M65319 Trigger thumb, unspecified thumb: Secondary | ICD-10-CM | POA: Diagnosis not present

## 2023-06-29 DIAGNOSIS — Z79891 Long term (current) use of opiate analgesic: Secondary | ICD-10-CM | POA: Diagnosis not present

## 2023-06-29 DIAGNOSIS — Z1389 Encounter for screening for other disorder: Secondary | ICD-10-CM | POA: Diagnosis not present

## 2023-06-29 DIAGNOSIS — Z981 Arthrodesis status: Secondary | ICD-10-CM | POA: Diagnosis not present

## 2023-06-29 DIAGNOSIS — G894 Chronic pain syndrome: Secondary | ICD-10-CM | POA: Diagnosis not present

## 2023-06-29 DIAGNOSIS — I83813 Varicose veins of bilateral lower extremities with pain: Secondary | ICD-10-CM | POA: Diagnosis not present

## 2023-06-29 DIAGNOSIS — M47816 Spondylosis without myelopathy or radiculopathy, lumbar region: Secondary | ICD-10-CM | POA: Diagnosis not present

## 2023-06-29 DIAGNOSIS — G629 Polyneuropathy, unspecified: Secondary | ICD-10-CM | POA: Diagnosis not present

## 2023-07-05 DIAGNOSIS — J449 Chronic obstructive pulmonary disease, unspecified: Secondary | ICD-10-CM | POA: Diagnosis not present

## 2023-07-06 DIAGNOSIS — J4540 Moderate persistent asthma, uncomplicated: Secondary | ICD-10-CM | POA: Diagnosis not present

## 2023-07-06 DIAGNOSIS — R400 Somnolence: Secondary | ICD-10-CM | POA: Diagnosis not present

## 2023-07-06 DIAGNOSIS — Z8616 Personal history of COVID-19: Secondary | ICD-10-CM | POA: Diagnosis not present

## 2023-07-06 DIAGNOSIS — J069 Acute upper respiratory infection, unspecified: Secondary | ICD-10-CM | POA: Diagnosis not present

## 2023-07-06 DIAGNOSIS — R0600 Dyspnea, unspecified: Secondary | ICD-10-CM | POA: Diagnosis not present

## 2023-07-06 DIAGNOSIS — G4736 Sleep related hypoventilation in conditions classified elsewhere: Secondary | ICD-10-CM | POA: Diagnosis not present

## 2023-07-06 DIAGNOSIS — R5383 Other fatigue: Secondary | ICD-10-CM | POA: Diagnosis not present

## 2023-07-07 DIAGNOSIS — M1712 Unilateral primary osteoarthritis, left knee: Secondary | ICD-10-CM | POA: Diagnosis not present

## 2023-07-09 DIAGNOSIS — I10 Essential (primary) hypertension: Secondary | ICD-10-CM | POA: Diagnosis not present

## 2023-07-09 DIAGNOSIS — Z6841 Body Mass Index (BMI) 40.0 and over, adult: Secondary | ICD-10-CM | POA: Diagnosis not present

## 2023-07-09 DIAGNOSIS — E1169 Type 2 diabetes mellitus with other specified complication: Secondary | ICD-10-CM | POA: Diagnosis not present

## 2023-07-09 DIAGNOSIS — E0590 Thyrotoxicosis, unspecified without thyrotoxic crisis or storm: Secondary | ICD-10-CM | POA: Diagnosis not present

## 2023-07-09 DIAGNOSIS — E785 Hyperlipidemia, unspecified: Secondary | ICD-10-CM | POA: Diagnosis not present

## 2023-07-10 DIAGNOSIS — I700 Atherosclerosis of aorta: Secondary | ICD-10-CM | POA: Diagnosis not present

## 2023-07-10 DIAGNOSIS — R0602 Shortness of breath: Secondary | ICD-10-CM | POA: Diagnosis not present

## 2023-07-10 DIAGNOSIS — I2510 Atherosclerotic heart disease of native coronary artery without angina pectoris: Secondary | ICD-10-CM | POA: Diagnosis not present

## 2023-07-10 DIAGNOSIS — R918 Other nonspecific abnormal finding of lung field: Secondary | ICD-10-CM | POA: Diagnosis not present

## 2023-07-10 DIAGNOSIS — D734 Cyst of spleen: Secondary | ICD-10-CM | POA: Diagnosis not present

## 2023-07-10 DIAGNOSIS — J439 Emphysema, unspecified: Secondary | ICD-10-CM | POA: Diagnosis not present

## 2023-07-10 DIAGNOSIS — R0600 Dyspnea, unspecified: Secondary | ICD-10-CM | POA: Diagnosis not present

## 2023-07-26 DIAGNOSIS — E1169 Type 2 diabetes mellitus with other specified complication: Secondary | ICD-10-CM | POA: Diagnosis not present

## 2023-07-26 DIAGNOSIS — I10 Essential (primary) hypertension: Secondary | ICD-10-CM | POA: Diagnosis not present

## 2023-07-28 DIAGNOSIS — M5136 Other intervertebral disc degeneration, lumbar region: Secondary | ICD-10-CM | POA: Diagnosis not present

## 2023-07-28 DIAGNOSIS — Z79891 Long term (current) use of opiate analgesic: Secondary | ICD-10-CM | POA: Diagnosis not present

## 2023-07-28 DIAGNOSIS — Z981 Arthrodesis status: Secondary | ICD-10-CM | POA: Diagnosis not present

## 2023-07-28 DIAGNOSIS — R5383 Other fatigue: Secondary | ICD-10-CM | POA: Diagnosis not present

## 2023-07-28 DIAGNOSIS — R0600 Dyspnea, unspecified: Secondary | ICD-10-CM | POA: Diagnosis not present

## 2023-07-28 DIAGNOSIS — G4736 Sleep related hypoventilation in conditions classified elsewhere: Secondary | ICD-10-CM | POA: Diagnosis not present

## 2023-07-28 DIAGNOSIS — Z1389 Encounter for screening for other disorder: Secondary | ICD-10-CM | POA: Diagnosis not present

## 2023-07-28 DIAGNOSIS — J4540 Moderate persistent asthma, uncomplicated: Secondary | ICD-10-CM | POA: Diagnosis not present

## 2023-07-28 DIAGNOSIS — R400 Somnolence: Secondary | ICD-10-CM | POA: Diagnosis not present

## 2023-07-28 DIAGNOSIS — Z8616 Personal history of COVID-19: Secondary | ICD-10-CM | POA: Diagnosis not present

## 2023-07-28 DIAGNOSIS — E669 Obesity, unspecified: Secondary | ICD-10-CM | POA: Diagnosis not present

## 2023-07-28 DIAGNOSIS — M65319 Trigger thumb, unspecified thumb: Secondary | ICD-10-CM | POA: Diagnosis not present

## 2023-07-28 DIAGNOSIS — M47816 Spondylosis without myelopathy or radiculopathy, lumbar region: Secondary | ICD-10-CM | POA: Diagnosis not present

## 2023-07-28 DIAGNOSIS — I83813 Varicose veins of bilateral lower extremities with pain: Secondary | ICD-10-CM | POA: Diagnosis not present

## 2023-07-28 DIAGNOSIS — G629 Polyneuropathy, unspecified: Secondary | ICD-10-CM | POA: Diagnosis not present

## 2023-08-04 DIAGNOSIS — J45901 Unspecified asthma with (acute) exacerbation: Secondary | ICD-10-CM | POA: Diagnosis not present

## 2023-08-05 DIAGNOSIS — J449 Chronic obstructive pulmonary disease, unspecified: Secondary | ICD-10-CM | POA: Diagnosis not present

## 2023-08-14 DIAGNOSIS — Z23 Encounter for immunization: Secondary | ICD-10-CM | POA: Diagnosis not present

## 2023-08-25 DIAGNOSIS — G629 Polyneuropathy, unspecified: Secondary | ICD-10-CM | POA: Diagnosis not present

## 2023-08-25 DIAGNOSIS — M65319 Trigger thumb, unspecified thumb: Secondary | ICD-10-CM | POA: Diagnosis not present

## 2023-08-25 DIAGNOSIS — Z1389 Encounter for screening for other disorder: Secondary | ICD-10-CM | POA: Diagnosis not present

## 2023-08-25 DIAGNOSIS — Z981 Arthrodesis status: Secondary | ICD-10-CM | POA: Diagnosis not present

## 2023-08-25 DIAGNOSIS — I10 Essential (primary) hypertension: Secondary | ICD-10-CM | POA: Diagnosis not present

## 2023-08-25 DIAGNOSIS — Z79891 Long term (current) use of opiate analgesic: Secondary | ICD-10-CM | POA: Diagnosis not present

## 2023-08-25 DIAGNOSIS — M5136 Other intervertebral disc degeneration, lumbar region with discogenic back pain only: Secondary | ICD-10-CM | POA: Diagnosis not present

## 2023-08-25 DIAGNOSIS — I83813 Varicose veins of bilateral lower extremities with pain: Secondary | ICD-10-CM | POA: Diagnosis not present

## 2023-08-25 DIAGNOSIS — M47816 Spondylosis without myelopathy or radiculopathy, lumbar region: Secondary | ICD-10-CM | POA: Diagnosis not present

## 2023-08-25 DIAGNOSIS — E669 Obesity, unspecified: Secondary | ICD-10-CM | POA: Diagnosis not present

## 2023-08-25 DIAGNOSIS — E1169 Type 2 diabetes mellitus with other specified complication: Secondary | ICD-10-CM | POA: Diagnosis not present

## 2023-09-03 DIAGNOSIS — J45901 Unspecified asthma with (acute) exacerbation: Secondary | ICD-10-CM | POA: Diagnosis not present

## 2023-09-04 DIAGNOSIS — J449 Chronic obstructive pulmonary disease, unspecified: Secondary | ICD-10-CM | POA: Diagnosis not present

## 2023-09-25 DIAGNOSIS — E1169 Type 2 diabetes mellitus with other specified complication: Secondary | ICD-10-CM | POA: Diagnosis not present

## 2023-09-25 DIAGNOSIS — I10 Essential (primary) hypertension: Secondary | ICD-10-CM | POA: Diagnosis not present

## 2023-09-29 DIAGNOSIS — M65319 Trigger thumb, unspecified thumb: Secondary | ICD-10-CM | POA: Diagnosis not present

## 2023-09-29 DIAGNOSIS — G629 Polyneuropathy, unspecified: Secondary | ICD-10-CM | POA: Diagnosis not present

## 2023-09-29 DIAGNOSIS — Z981 Arthrodesis status: Secondary | ICD-10-CM | POA: Diagnosis not present

## 2023-09-29 DIAGNOSIS — Z79891 Long term (current) use of opiate analgesic: Secondary | ICD-10-CM | POA: Diagnosis not present

## 2023-09-29 DIAGNOSIS — M5136 Other intervertebral disc degeneration, lumbar region with discogenic back pain only: Secondary | ICD-10-CM | POA: Diagnosis not present

## 2023-09-29 DIAGNOSIS — E669 Obesity, unspecified: Secondary | ICD-10-CM | POA: Diagnosis not present

## 2023-09-29 DIAGNOSIS — Z1389 Encounter for screening for other disorder: Secondary | ICD-10-CM | POA: Diagnosis not present

## 2023-09-29 DIAGNOSIS — I83813 Varicose veins of bilateral lower extremities with pain: Secondary | ICD-10-CM | POA: Diagnosis not present

## 2023-09-29 DIAGNOSIS — M47816 Spondylosis without myelopathy or radiculopathy, lumbar region: Secondary | ICD-10-CM | POA: Diagnosis not present

## 2023-10-04 DIAGNOSIS — J45901 Unspecified asthma with (acute) exacerbation: Secondary | ICD-10-CM | POA: Diagnosis not present

## 2023-10-05 DIAGNOSIS — J449 Chronic obstructive pulmonary disease, unspecified: Secondary | ICD-10-CM | POA: Diagnosis not present

## 2023-10-07 DIAGNOSIS — Z20822 Contact with and (suspected) exposure to covid-19: Secondary | ICD-10-CM | POA: Diagnosis not present

## 2023-10-07 DIAGNOSIS — J029 Acute pharyngitis, unspecified: Secondary | ICD-10-CM | POA: Diagnosis not present

## 2023-10-07 DIAGNOSIS — R590 Localized enlarged lymph nodes: Secondary | ICD-10-CM | POA: Diagnosis not present

## 2023-10-21 DIAGNOSIS — Z5320 Procedure and treatment not carried out because of patient's decision for unspecified reasons: Secondary | ICD-10-CM | POA: Diagnosis not present

## 2023-10-21 DIAGNOSIS — J9610 Chronic respiratory failure, unspecified whether with hypoxia or hypercapnia: Secondary | ICD-10-CM | POA: Diagnosis not present

## 2023-10-21 DIAGNOSIS — Z6841 Body Mass Index (BMI) 40.0 and over, adult: Secondary | ICD-10-CM | POA: Diagnosis not present

## 2023-10-21 DIAGNOSIS — R600 Localized edema: Secondary | ICD-10-CM | POA: Diagnosis not present

## 2023-10-21 DIAGNOSIS — R590 Localized enlarged lymph nodes: Secondary | ICD-10-CM | POA: Diagnosis not present

## 2023-10-27 DIAGNOSIS — M1711 Unilateral primary osteoarthritis, right knee: Secondary | ICD-10-CM | POA: Diagnosis not present

## 2023-10-30 DIAGNOSIS — R590 Localized enlarged lymph nodes: Secondary | ICD-10-CM | POA: Diagnosis not present

## 2023-11-03 DIAGNOSIS — J45901 Unspecified asthma with (acute) exacerbation: Secondary | ICD-10-CM | POA: Diagnosis not present

## 2023-11-04 DIAGNOSIS — J449 Chronic obstructive pulmonary disease, unspecified: Secondary | ICD-10-CM | POA: Diagnosis not present

## 2023-11-06 DIAGNOSIS — M65319 Trigger thumb, unspecified thumb: Secondary | ICD-10-CM | POA: Diagnosis not present

## 2023-11-06 DIAGNOSIS — M47816 Spondylosis without myelopathy or radiculopathy, lumbar region: Secondary | ICD-10-CM | POA: Diagnosis not present

## 2023-11-06 DIAGNOSIS — I83813 Varicose veins of bilateral lower extremities with pain: Secondary | ICD-10-CM | POA: Diagnosis not present

## 2023-11-06 DIAGNOSIS — G629 Polyneuropathy, unspecified: Secondary | ICD-10-CM | POA: Diagnosis not present

## 2023-11-06 DIAGNOSIS — Z981 Arthrodesis status: Secondary | ICD-10-CM | POA: Diagnosis not present

## 2023-11-06 DIAGNOSIS — M5136 Other intervertebral disc degeneration, lumbar region with discogenic back pain only: Secondary | ICD-10-CM | POA: Diagnosis not present

## 2023-11-06 DIAGNOSIS — Z79891 Long term (current) use of opiate analgesic: Secondary | ICD-10-CM | POA: Diagnosis not present

## 2023-11-06 DIAGNOSIS — E669 Obesity, unspecified: Secondary | ICD-10-CM | POA: Diagnosis not present

## 2023-11-06 DIAGNOSIS — Z1389 Encounter for screening for other disorder: Secondary | ICD-10-CM | POA: Diagnosis not present

## 2023-11-10 DIAGNOSIS — J8410 Pulmonary fibrosis, unspecified: Secondary | ICD-10-CM | POA: Diagnosis not present

## 2023-11-10 DIAGNOSIS — J4540 Moderate persistent asthma, uncomplicated: Secondary | ICD-10-CM | POA: Diagnosis not present

## 2023-11-10 DIAGNOSIS — G4736 Sleep related hypoventilation in conditions classified elsewhere: Secondary | ICD-10-CM | POA: Diagnosis not present

## 2023-11-10 DIAGNOSIS — R400 Somnolence: Secondary | ICD-10-CM | POA: Diagnosis not present

## 2023-11-24 DIAGNOSIS — G629 Polyneuropathy, unspecified: Secondary | ICD-10-CM | POA: Diagnosis not present

## 2023-11-24 DIAGNOSIS — G894 Chronic pain syndrome: Secondary | ICD-10-CM | POA: Diagnosis not present

## 2023-11-25 DIAGNOSIS — J9610 Chronic respiratory failure, unspecified whether with hypoxia or hypercapnia: Secondary | ICD-10-CM | POA: Diagnosis not present

## 2023-11-25 DIAGNOSIS — E1169 Type 2 diabetes mellitus with other specified complication: Secondary | ICD-10-CM | POA: Diagnosis not present

## 2023-11-25 DIAGNOSIS — I10 Essential (primary) hypertension: Secondary | ICD-10-CM | POA: Diagnosis not present

## 2023-11-26 DIAGNOSIS — G894 Chronic pain syndrome: Secondary | ICD-10-CM | POA: Diagnosis not present

## 2023-11-26 DIAGNOSIS — G629 Polyneuropathy, unspecified: Secondary | ICD-10-CM | POA: Diagnosis not present

## 2023-12-01 DIAGNOSIS — I10 Essential (primary) hypertension: Secondary | ICD-10-CM | POA: Diagnosis not present

## 2023-12-01 DIAGNOSIS — E1169 Type 2 diabetes mellitus with other specified complication: Secondary | ICD-10-CM | POA: Diagnosis not present

## 2023-12-01 DIAGNOSIS — E785 Hyperlipidemia, unspecified: Secondary | ICD-10-CM | POA: Diagnosis not present

## 2023-12-01 DIAGNOSIS — E0590 Thyrotoxicosis, unspecified without thyrotoxic crisis or storm: Secondary | ICD-10-CM | POA: Diagnosis not present

## 2023-12-07 DIAGNOSIS — E1169 Type 2 diabetes mellitus with other specified complication: Secondary | ICD-10-CM | POA: Diagnosis not present

## 2023-12-07 DIAGNOSIS — I10 Essential (primary) hypertension: Secondary | ICD-10-CM | POA: Diagnosis not present

## 2023-12-07 DIAGNOSIS — Z6841 Body Mass Index (BMI) 40.0 and over, adult: Secondary | ICD-10-CM | POA: Diagnosis not present

## 2023-12-07 DIAGNOSIS — J9610 Chronic respiratory failure, unspecified whether with hypoxia or hypercapnia: Secondary | ICD-10-CM | POA: Diagnosis not present

## 2023-12-07 DIAGNOSIS — E0590 Thyrotoxicosis, unspecified without thyrotoxic crisis or storm: Secondary | ICD-10-CM | POA: Diagnosis not present

## 2023-12-07 DIAGNOSIS — E785 Hyperlipidemia, unspecified: Secondary | ICD-10-CM | POA: Diagnosis not present

## 2023-12-10 DIAGNOSIS — Z1231 Encounter for screening mammogram for malignant neoplasm of breast: Secondary | ICD-10-CM | POA: Diagnosis not present

## 2023-12-15 DIAGNOSIS — Z79891 Long term (current) use of opiate analgesic: Secondary | ICD-10-CM | POA: Diagnosis not present

## 2023-12-15 DIAGNOSIS — Z1389 Encounter for screening for other disorder: Secondary | ICD-10-CM | POA: Diagnosis not present

## 2023-12-15 DIAGNOSIS — M65319 Trigger thumb, unspecified thumb: Secondary | ICD-10-CM | POA: Diagnosis not present

## 2023-12-15 DIAGNOSIS — Z981 Arthrodesis status: Secondary | ICD-10-CM | POA: Diagnosis not present

## 2023-12-15 DIAGNOSIS — I83813 Varicose veins of bilateral lower extremities with pain: Secondary | ICD-10-CM | POA: Diagnosis not present

## 2023-12-15 DIAGNOSIS — M5136 Other intervertebral disc degeneration, lumbar region with discogenic back pain only: Secondary | ICD-10-CM | POA: Diagnosis not present

## 2023-12-15 DIAGNOSIS — M47816 Spondylosis without myelopathy or radiculopathy, lumbar region: Secondary | ICD-10-CM | POA: Diagnosis not present

## 2023-12-15 DIAGNOSIS — G629 Polyneuropathy, unspecified: Secondary | ICD-10-CM | POA: Diagnosis not present

## 2023-12-15 DIAGNOSIS — E669 Obesity, unspecified: Secondary | ICD-10-CM | POA: Diagnosis not present

## 2023-12-15 DIAGNOSIS — G894 Chronic pain syndrome: Secondary | ICD-10-CM | POA: Diagnosis not present

## 2023-12-18 DIAGNOSIS — Z1231 Encounter for screening mammogram for malignant neoplasm of breast: Secondary | ICD-10-CM | POA: Diagnosis not present

## 2023-12-25 DIAGNOSIS — I10 Essential (primary) hypertension: Secondary | ICD-10-CM | POA: Diagnosis not present

## 2023-12-26 DIAGNOSIS — I10 Essential (primary) hypertension: Secondary | ICD-10-CM | POA: Diagnosis not present

## 2023-12-26 DIAGNOSIS — E1169 Type 2 diabetes mellitus with other specified complication: Secondary | ICD-10-CM | POA: Diagnosis not present

## 2023-12-28 DIAGNOSIS — R590 Localized enlarged lymph nodes: Secondary | ICD-10-CM | POA: Diagnosis not present

## 2024-01-01 DIAGNOSIS — G894 Chronic pain syndrome: Secondary | ICD-10-CM | POA: Diagnosis not present

## 2024-01-01 DIAGNOSIS — G629 Polyneuropathy, unspecified: Secondary | ICD-10-CM | POA: Diagnosis not present

## 2024-01-04 ENCOUNTER — Other Ambulatory Visit (HOSPITAL_BASED_OUTPATIENT_CLINIC_OR_DEPARTMENT_OTHER): Payer: Self-pay | Admitting: Physician Assistant

## 2024-01-04 ENCOUNTER — Ambulatory Visit (HOSPITAL_BASED_OUTPATIENT_CLINIC_OR_DEPARTMENT_OTHER)
Admission: RE | Admit: 2024-01-04 | Discharge: 2024-01-04 | Disposition: A | Discharge status: Home / Self Care | Payer: HMO | Source: Ambulatory Visit | Attending: Physician Assistant | Admitting: Physician Assistant

## 2024-01-04 DIAGNOSIS — R062 Wheezing: Secondary | ICD-10-CM

## 2024-01-04 DIAGNOSIS — Z20822 Contact with and (suspected) exposure to covid-19: Secondary | ICD-10-CM | POA: Diagnosis not present

## 2024-01-04 DIAGNOSIS — J189 Pneumonia, unspecified organism: Secondary | ICD-10-CM | POA: Diagnosis not present

## 2024-01-04 DIAGNOSIS — R600 Localized edema: Secondary | ICD-10-CM | POA: Diagnosis not present

## 2024-01-04 DIAGNOSIS — Z6841 Body Mass Index (BMI) 40.0 and over, adult: Secondary | ICD-10-CM | POA: Diagnosis not present

## 2024-01-04 DIAGNOSIS — R509 Fever, unspecified: Secondary | ICD-10-CM | POA: Diagnosis not present

## 2024-01-04 DIAGNOSIS — I771 Stricture of artery: Secondary | ICD-10-CM | POA: Diagnosis not present

## 2024-01-07 DIAGNOSIS — J9610 Chronic respiratory failure, unspecified whether with hypoxia or hypercapnia: Secondary | ICD-10-CM | POA: Diagnosis not present

## 2024-01-07 DIAGNOSIS — Z6841 Body Mass Index (BMI) 40.0 and over, adult: Secondary | ICD-10-CM | POA: Diagnosis not present

## 2024-01-07 DIAGNOSIS — R600 Localized edema: Secondary | ICD-10-CM | POA: Diagnosis not present

## 2024-01-13 DIAGNOSIS — Z981 Arthrodesis status: Secondary | ICD-10-CM | POA: Diagnosis not present

## 2024-01-13 DIAGNOSIS — Z79891 Long term (current) use of opiate analgesic: Secondary | ICD-10-CM | POA: Diagnosis not present

## 2024-01-13 DIAGNOSIS — Z1389 Encounter for screening for other disorder: Secondary | ICD-10-CM | POA: Diagnosis not present

## 2024-01-13 DIAGNOSIS — E669 Obesity, unspecified: Secondary | ICD-10-CM | POA: Diagnosis not present

## 2024-01-13 DIAGNOSIS — G629 Polyneuropathy, unspecified: Secondary | ICD-10-CM | POA: Diagnosis not present

## 2024-01-13 DIAGNOSIS — M65319 Trigger thumb, unspecified thumb: Secondary | ICD-10-CM | POA: Diagnosis not present

## 2024-01-13 DIAGNOSIS — M47816 Spondylosis without myelopathy or radiculopathy, lumbar region: Secondary | ICD-10-CM | POA: Diagnosis not present

## 2024-01-13 DIAGNOSIS — I83813 Varicose veins of bilateral lower extremities with pain: Secondary | ICD-10-CM | POA: Diagnosis not present

## 2024-01-13 DIAGNOSIS — M5136 Other intervertebral disc degeneration, lumbar region with discogenic back pain only: Secondary | ICD-10-CM | POA: Diagnosis not present

## 2024-01-13 DIAGNOSIS — G894 Chronic pain syndrome: Secondary | ICD-10-CM | POA: Diagnosis not present

## 2024-01-15 DIAGNOSIS — G894 Chronic pain syndrome: Secondary | ICD-10-CM | POA: Diagnosis not present

## 2024-01-15 DIAGNOSIS — G629 Polyneuropathy, unspecified: Secondary | ICD-10-CM | POA: Diagnosis not present

## 2024-01-21 DIAGNOSIS — R600 Localized edema: Secondary | ICD-10-CM | POA: Diagnosis not present

## 2024-01-21 DIAGNOSIS — Z6841 Body Mass Index (BMI) 40.0 and over, adult: Secondary | ICD-10-CM | POA: Diagnosis not present

## 2024-01-22 DIAGNOSIS — I10 Essential (primary) hypertension: Secondary | ICD-10-CM | POA: Diagnosis not present

## 2024-01-23 DIAGNOSIS — J9610 Chronic respiratory failure, unspecified whether with hypoxia or hypercapnia: Secondary | ICD-10-CM | POA: Diagnosis not present

## 2024-01-23 DIAGNOSIS — I10 Essential (primary) hypertension: Secondary | ICD-10-CM | POA: Diagnosis not present

## 2024-02-03 DIAGNOSIS — G4736 Sleep related hypoventilation in conditions classified elsewhere: Secondary | ICD-10-CM | POA: Diagnosis not present

## 2024-02-03 DIAGNOSIS — J8410 Pulmonary fibrosis, unspecified: Secondary | ICD-10-CM | POA: Diagnosis not present

## 2024-02-03 DIAGNOSIS — R400 Somnolence: Secondary | ICD-10-CM | POA: Diagnosis not present

## 2024-02-03 DIAGNOSIS — J4540 Moderate persistent asthma, uncomplicated: Secondary | ICD-10-CM | POA: Diagnosis not present

## 2024-02-10 DIAGNOSIS — Z1389 Encounter for screening for other disorder: Secondary | ICD-10-CM | POA: Diagnosis not present

## 2024-02-10 DIAGNOSIS — M65319 Trigger thumb, unspecified thumb: Secondary | ICD-10-CM | POA: Diagnosis not present

## 2024-02-10 DIAGNOSIS — M5136 Other intervertebral disc degeneration, lumbar region with discogenic back pain only: Secondary | ICD-10-CM | POA: Diagnosis not present

## 2024-02-10 DIAGNOSIS — E669 Obesity, unspecified: Secondary | ICD-10-CM | POA: Diagnosis not present

## 2024-02-10 DIAGNOSIS — I83813 Varicose veins of bilateral lower extremities with pain: Secondary | ICD-10-CM | POA: Diagnosis not present

## 2024-02-10 DIAGNOSIS — Z79891 Long term (current) use of opiate analgesic: Secondary | ICD-10-CM | POA: Diagnosis not present

## 2024-02-10 DIAGNOSIS — G894 Chronic pain syndrome: Secondary | ICD-10-CM | POA: Diagnosis not present

## 2024-02-10 DIAGNOSIS — G629 Polyneuropathy, unspecified: Secondary | ICD-10-CM | POA: Diagnosis not present

## 2024-02-10 DIAGNOSIS — M47816 Spondylosis without myelopathy or radiculopathy, lumbar region: Secondary | ICD-10-CM | POA: Diagnosis not present

## 2024-02-10 DIAGNOSIS — Z981 Arthrodesis status: Secondary | ICD-10-CM | POA: Diagnosis not present

## 2024-02-12 DIAGNOSIS — G894 Chronic pain syndrome: Secondary | ICD-10-CM | POA: Diagnosis not present

## 2024-02-12 DIAGNOSIS — M5136 Other intervertebral disc degeneration, lumbar region with discogenic back pain only: Secondary | ICD-10-CM | POA: Diagnosis not present

## 2024-02-15 DIAGNOSIS — Z6841 Body Mass Index (BMI) 40.0 and over, adult: Secondary | ICD-10-CM | POA: Diagnosis not present

## 2024-02-15 DIAGNOSIS — R600 Localized edema: Secondary | ICD-10-CM | POA: Diagnosis not present

## 2024-02-15 DIAGNOSIS — I872 Venous insufficiency (chronic) (peripheral): Secondary | ICD-10-CM | POA: Diagnosis not present

## 2024-02-23 DIAGNOSIS — Z6841 Body Mass Index (BMI) 40.0 and over, adult: Secondary | ICD-10-CM | POA: Diagnosis not present

## 2024-02-23 DIAGNOSIS — I10 Essential (primary) hypertension: Secondary | ICD-10-CM | POA: Diagnosis not present

## 2024-02-23 DIAGNOSIS — E0590 Thyrotoxicosis, unspecified without thyrotoxic crisis or storm: Secondary | ICD-10-CM | POA: Diagnosis not present

## 2024-02-24 DIAGNOSIS — I83819 Varicose veins of unspecified lower extremities with pain: Secondary | ICD-10-CM | POA: Diagnosis not present

## 2024-02-24 DIAGNOSIS — Z1330 Encounter for screening examination for mental health and behavioral disorders, unspecified: Secondary | ICD-10-CM | POA: Diagnosis not present

## 2024-03-01 DIAGNOSIS — M1712 Unilateral primary osteoarthritis, left knee: Secondary | ICD-10-CM | POA: Diagnosis not present

## 2024-03-04 DIAGNOSIS — G894 Chronic pain syndrome: Secondary | ICD-10-CM | POA: Diagnosis not present

## 2024-03-04 DIAGNOSIS — M5136 Other intervertebral disc degeneration, lumbar region with discogenic back pain only: Secondary | ICD-10-CM | POA: Diagnosis not present

## 2024-03-08 DIAGNOSIS — M47816 Spondylosis without myelopathy or radiculopathy, lumbar region: Secondary | ICD-10-CM | POA: Diagnosis not present

## 2024-03-08 DIAGNOSIS — Z981 Arthrodesis status: Secondary | ICD-10-CM | POA: Diagnosis not present

## 2024-03-08 DIAGNOSIS — G629 Polyneuropathy, unspecified: Secondary | ICD-10-CM | POA: Diagnosis not present

## 2024-03-08 DIAGNOSIS — G894 Chronic pain syndrome: Secondary | ICD-10-CM | POA: Diagnosis not present

## 2024-03-08 DIAGNOSIS — Z1389 Encounter for screening for other disorder: Secondary | ICD-10-CM | POA: Diagnosis not present

## 2024-03-08 DIAGNOSIS — M5136 Other intervertebral disc degeneration, lumbar region with discogenic back pain only: Secondary | ICD-10-CM | POA: Diagnosis not present

## 2024-03-08 DIAGNOSIS — I83813 Varicose veins of bilateral lower extremities with pain: Secondary | ICD-10-CM | POA: Diagnosis not present

## 2024-03-08 DIAGNOSIS — E669 Obesity, unspecified: Secondary | ICD-10-CM | POA: Diagnosis not present

## 2024-03-08 DIAGNOSIS — Z79891 Long term (current) use of opiate analgesic: Secondary | ICD-10-CM | POA: Diagnosis not present

## 2024-03-08 DIAGNOSIS — M65319 Trigger thumb, unspecified thumb: Secondary | ICD-10-CM | POA: Diagnosis not present

## 2024-03-10 DIAGNOSIS — I83819 Varicose veins of unspecified lower extremities with pain: Secondary | ICD-10-CM | POA: Diagnosis not present

## 2024-03-23 DIAGNOSIS — I10 Essential (primary) hypertension: Secondary | ICD-10-CM | POA: Diagnosis not present

## 2024-03-24 DIAGNOSIS — E0590 Thyrotoxicosis, unspecified without thyrotoxic crisis or storm: Secondary | ICD-10-CM | POA: Diagnosis not present

## 2024-03-24 DIAGNOSIS — I10 Essential (primary) hypertension: Secondary | ICD-10-CM | POA: Diagnosis not present

## 2024-03-24 DIAGNOSIS — Z6841 Body Mass Index (BMI) 40.0 and over, adult: Secondary | ICD-10-CM | POA: Diagnosis not present

## 2024-04-05 DIAGNOSIS — E1169 Type 2 diabetes mellitus with other specified complication: Secondary | ICD-10-CM | POA: Diagnosis not present

## 2024-04-05 DIAGNOSIS — I10 Essential (primary) hypertension: Secondary | ICD-10-CM | POA: Diagnosis not present

## 2024-04-05 DIAGNOSIS — E785 Hyperlipidemia, unspecified: Secondary | ICD-10-CM | POA: Diagnosis not present

## 2024-04-12 DIAGNOSIS — E1169 Type 2 diabetes mellitus with other specified complication: Secondary | ICD-10-CM | POA: Diagnosis not present

## 2024-04-12 DIAGNOSIS — M1711 Unilateral primary osteoarthritis, right knee: Secondary | ICD-10-CM | POA: Diagnosis not present

## 2024-04-12 DIAGNOSIS — E785 Hyperlipidemia, unspecified: Secondary | ICD-10-CM | POA: Diagnosis not present

## 2024-04-12 DIAGNOSIS — L989 Disorder of the skin and subcutaneous tissue, unspecified: Secondary | ICD-10-CM | POA: Diagnosis not present

## 2024-04-12 DIAGNOSIS — I10 Essential (primary) hypertension: Secondary | ICD-10-CM | POA: Diagnosis not present

## 2024-04-12 DIAGNOSIS — Z6841 Body Mass Index (BMI) 40.0 and over, adult: Secondary | ICD-10-CM | POA: Diagnosis not present

## 2024-04-14 DIAGNOSIS — Z6841 Body Mass Index (BMI) 40.0 and over, adult: Secondary | ICD-10-CM | POA: Diagnosis not present

## 2024-04-14 DIAGNOSIS — L281 Prurigo nodularis: Secondary | ICD-10-CM | POA: Diagnosis not present

## 2024-04-14 DIAGNOSIS — L989 Disorder of the skin and subcutaneous tissue, unspecified: Secondary | ICD-10-CM | POA: Diagnosis not present

## 2024-04-19 DIAGNOSIS — M48061 Spinal stenosis, lumbar region without neurogenic claudication: Secondary | ICD-10-CM | POA: Diagnosis not present

## 2024-04-19 DIAGNOSIS — G894 Chronic pain syndrome: Secondary | ICD-10-CM | POA: Diagnosis not present

## 2024-04-21 DIAGNOSIS — Z6841 Body Mass Index (BMI) 40.0 and over, adult: Secondary | ICD-10-CM | POA: Diagnosis not present

## 2024-04-21 DIAGNOSIS — L281 Prurigo nodularis: Secondary | ICD-10-CM | POA: Diagnosis not present

## 2024-04-22 DIAGNOSIS — M5136 Other intervertebral disc degeneration, lumbar region with discogenic back pain only: Secondary | ICD-10-CM | POA: Diagnosis not present

## 2024-04-22 DIAGNOSIS — G894 Chronic pain syndrome: Secondary | ICD-10-CM | POA: Diagnosis not present

## 2024-04-23 DIAGNOSIS — I10 Essential (primary) hypertension: Secondary | ICD-10-CM | POA: Diagnosis not present

## 2024-04-24 DIAGNOSIS — I10 Essential (primary) hypertension: Secondary | ICD-10-CM | POA: Diagnosis not present

## 2024-04-24 DIAGNOSIS — Z6841 Body Mass Index (BMI) 40.0 and over, adult: Secondary | ICD-10-CM | POA: Diagnosis not present

## 2024-05-04 DIAGNOSIS — Z8616 Personal history of COVID-19: Secondary | ICD-10-CM | POA: Diagnosis not present

## 2024-05-04 DIAGNOSIS — R400 Somnolence: Secondary | ICD-10-CM | POA: Diagnosis not present

## 2024-05-04 DIAGNOSIS — J4540 Moderate persistent asthma, uncomplicated: Secondary | ICD-10-CM | POA: Diagnosis not present

## 2024-05-04 DIAGNOSIS — J8410 Pulmonary fibrosis, unspecified: Secondary | ICD-10-CM | POA: Diagnosis not present

## 2024-05-04 DIAGNOSIS — G4736 Sleep related hypoventilation in conditions classified elsewhere: Secondary | ICD-10-CM | POA: Diagnosis not present

## 2024-05-17 DIAGNOSIS — J8410 Pulmonary fibrosis, unspecified: Secondary | ICD-10-CM | POA: Diagnosis not present

## 2024-05-19 DIAGNOSIS — M48061 Spinal stenosis, lumbar region without neurogenic claudication: Secondary | ICD-10-CM | POA: Diagnosis not present

## 2024-05-23 DIAGNOSIS — G894 Chronic pain syndrome: Secondary | ICD-10-CM | POA: Diagnosis not present

## 2024-05-23 DIAGNOSIS — M5136 Other intervertebral disc degeneration, lumbar region with discogenic back pain only: Secondary | ICD-10-CM | POA: Diagnosis not present

## 2024-05-23 DIAGNOSIS — I10 Essential (primary) hypertension: Secondary | ICD-10-CM | POA: Diagnosis not present

## 2024-05-24 DIAGNOSIS — I10 Essential (primary) hypertension: Secondary | ICD-10-CM | POA: Diagnosis not present

## 2024-05-24 DIAGNOSIS — Z6841 Body Mass Index (BMI) 40.0 and over, adult: Secondary | ICD-10-CM | POA: Diagnosis not present

## 2024-06-14 DIAGNOSIS — R0600 Dyspnea, unspecified: Secondary | ICD-10-CM | POA: Diagnosis not present

## 2024-06-14 DIAGNOSIS — J4540 Moderate persistent asthma, uncomplicated: Secondary | ICD-10-CM | POA: Diagnosis not present

## 2024-06-14 DIAGNOSIS — Z8616 Personal history of COVID-19: Secondary | ICD-10-CM | POA: Diagnosis not present

## 2024-06-14 DIAGNOSIS — G4736 Sleep related hypoventilation in conditions classified elsewhere: Secondary | ICD-10-CM | POA: Diagnosis not present

## 2024-06-22 DIAGNOSIS — G894 Chronic pain syndrome: Secondary | ICD-10-CM | POA: Diagnosis not present

## 2024-06-22 DIAGNOSIS — M48061 Spinal stenosis, lumbar region without neurogenic claudication: Secondary | ICD-10-CM | POA: Diagnosis not present

## 2024-06-23 DIAGNOSIS — G894 Chronic pain syndrome: Secondary | ICD-10-CM | POA: Diagnosis not present

## 2024-06-23 DIAGNOSIS — M5136 Other intervertebral disc degeneration, lumbar region with discogenic back pain only: Secondary | ICD-10-CM | POA: Diagnosis not present

## 2024-06-23 DIAGNOSIS — I10 Essential (primary) hypertension: Secondary | ICD-10-CM | POA: Diagnosis not present

## 2024-06-24 DIAGNOSIS — I10 Essential (primary) hypertension: Secondary | ICD-10-CM | POA: Diagnosis not present

## 2024-06-24 DIAGNOSIS — Z6841 Body Mass Index (BMI) 40.0 and over, adult: Secondary | ICD-10-CM | POA: Diagnosis not present

## 2024-07-22 DIAGNOSIS — M5136 Other intervertebral disc degeneration, lumbar region with discogenic back pain only: Secondary | ICD-10-CM | POA: Diagnosis not present

## 2024-07-22 DIAGNOSIS — G894 Chronic pain syndrome: Secondary | ICD-10-CM | POA: Diagnosis not present

## 2024-07-24 DIAGNOSIS — I10 Essential (primary) hypertension: Secondary | ICD-10-CM | POA: Diagnosis not present

## 2024-07-25 DIAGNOSIS — Z6841 Body Mass Index (BMI) 40.0 and over, adult: Secondary | ICD-10-CM | POA: Diagnosis not present

## 2024-07-25 DIAGNOSIS — I10 Essential (primary) hypertension: Secondary | ICD-10-CM | POA: Diagnosis not present

## 2024-08-02 DIAGNOSIS — G894 Chronic pain syndrome: Secondary | ICD-10-CM | POA: Diagnosis not present

## 2024-08-02 DIAGNOSIS — Z79891 Long term (current) use of opiate analgesic: Secondary | ICD-10-CM | POA: Diagnosis not present

## 2024-08-02 DIAGNOSIS — M48061 Spinal stenosis, lumbar region without neurogenic claudication: Secondary | ICD-10-CM | POA: Diagnosis not present

## 2024-08-09 DIAGNOSIS — E1169 Type 2 diabetes mellitus with other specified complication: Secondary | ICD-10-CM | POA: Diagnosis not present

## 2024-08-09 DIAGNOSIS — E0590 Thyrotoxicosis, unspecified without thyrotoxic crisis or storm: Secondary | ICD-10-CM | POA: Diagnosis not present

## 2024-08-09 DIAGNOSIS — I10 Essential (primary) hypertension: Secondary | ICD-10-CM | POA: Diagnosis not present

## 2024-08-09 DIAGNOSIS — E785 Hyperlipidemia, unspecified: Secondary | ICD-10-CM | POA: Diagnosis not present

## 2024-08-16 DIAGNOSIS — E785 Hyperlipidemia, unspecified: Secondary | ICD-10-CM | POA: Diagnosis not present

## 2024-08-16 DIAGNOSIS — E1169 Type 2 diabetes mellitus with other specified complication: Secondary | ICD-10-CM | POA: Diagnosis not present

## 2024-08-16 DIAGNOSIS — Z23 Encounter for immunization: Secondary | ICD-10-CM | POA: Diagnosis not present

## 2024-08-16 DIAGNOSIS — R7989 Other specified abnormal findings of blood chemistry: Secondary | ICD-10-CM | POA: Diagnosis not present

## 2024-08-16 DIAGNOSIS — Z6841 Body Mass Index (BMI) 40.0 and over, adult: Secondary | ICD-10-CM | POA: Diagnosis not present

## 2024-08-23 DIAGNOSIS — I10 Essential (primary) hypertension: Secondary | ICD-10-CM | POA: Diagnosis not present

## 2024-08-23 DIAGNOSIS — M47816 Spondylosis without myelopathy or radiculopathy, lumbar region: Secondary | ICD-10-CM | POA: Diagnosis not present

## 2024-08-23 DIAGNOSIS — G894 Chronic pain syndrome: Secondary | ICD-10-CM | POA: Diagnosis not present

## 2024-08-24 DIAGNOSIS — Z6841 Body Mass Index (BMI) 40.0 and over, adult: Secondary | ICD-10-CM | POA: Diagnosis not present

## 2024-08-24 DIAGNOSIS — E785 Hyperlipidemia, unspecified: Secondary | ICD-10-CM | POA: Diagnosis not present

## 2024-08-24 DIAGNOSIS — I10 Essential (primary) hypertension: Secondary | ICD-10-CM | POA: Diagnosis not present

## 2024-08-30 DIAGNOSIS — M170 Bilateral primary osteoarthritis of knee: Secondary | ICD-10-CM | POA: Diagnosis not present

## 2024-09-03 DIAGNOSIS — M5417 Radiculopathy, lumbosacral region: Secondary | ICD-10-CM | POA: Diagnosis not present

## 2024-09-03 DIAGNOSIS — M545 Low back pain, unspecified: Secondary | ICD-10-CM | POA: Diagnosis not present

## 2024-09-03 DIAGNOSIS — X58XXXA Exposure to other specified factors, initial encounter: Secondary | ICD-10-CM | POA: Diagnosis not present

## 2024-09-03 DIAGNOSIS — S39012A Strain of muscle, fascia and tendon of lower back, initial encounter: Secondary | ICD-10-CM | POA: Diagnosis not present

## 2024-09-14 DIAGNOSIS — Z789 Other specified health status: Secondary | ICD-10-CM | POA: Diagnosis not present

## 2024-09-14 DIAGNOSIS — G8929 Other chronic pain: Secondary | ICD-10-CM | POA: Diagnosis not present

## 2024-09-14 DIAGNOSIS — M5416 Radiculopathy, lumbar region: Secondary | ICD-10-CM | POA: Diagnosis not present

## 2024-09-14 DIAGNOSIS — Z6841 Body Mass Index (BMI) 40.0 and over, adult: Secondary | ICD-10-CM | POA: Diagnosis not present

## 2024-09-14 DIAGNOSIS — E785 Hyperlipidemia, unspecified: Secondary | ICD-10-CM | POA: Diagnosis not present

## 2024-09-14 DIAGNOSIS — M25561 Pain in right knee: Secondary | ICD-10-CM | POA: Diagnosis not present

## 2024-09-14 DIAGNOSIS — Z1231 Encounter for screening mammogram for malignant neoplasm of breast: Secondary | ICD-10-CM | POA: Diagnosis not present

## 2024-09-14 DIAGNOSIS — E1169 Type 2 diabetes mellitus with other specified complication: Secondary | ICD-10-CM | POA: Diagnosis not present

## 2024-09-23 DIAGNOSIS — I10 Essential (primary) hypertension: Secondary | ICD-10-CM | POA: Diagnosis not present

## 2024-09-23 DIAGNOSIS — G894 Chronic pain syndrome: Secondary | ICD-10-CM | POA: Diagnosis not present

## 2024-09-23 DIAGNOSIS — M5136 Other intervertebral disc degeneration, lumbar region with discogenic back pain only: Secondary | ICD-10-CM | POA: Diagnosis not present

## 2024-09-24 DIAGNOSIS — I10 Essential (primary) hypertension: Secondary | ICD-10-CM | POA: Diagnosis not present

## 2024-09-26 DIAGNOSIS — M1711 Unilateral primary osteoarthritis, right knee: Secondary | ICD-10-CM | POA: Diagnosis not present

## 2024-10-23 DIAGNOSIS — I10 Essential (primary) hypertension: Secondary | ICD-10-CM | POA: Diagnosis not present

## 2024-10-24 DIAGNOSIS — Z6841 Body Mass Index (BMI) 40.0 and over, adult: Secondary | ICD-10-CM | POA: Diagnosis not present

## 2024-10-24 DIAGNOSIS — I10 Essential (primary) hypertension: Secondary | ICD-10-CM | POA: Diagnosis not present
